# Patient Record
Sex: Female | Born: 1975 | Race: Black or African American | Hispanic: No | Marital: Single | Smoking: Former smoker
Health system: Southern US, Community
[De-identification: ages and names within clinical notes are randomized; demographics above are authoritative.]

## PROBLEM LIST (undated history)

## (undated) DIAGNOSIS — E079 Disorder of thyroid, unspecified: Secondary | ICD-10-CM

## (undated) DIAGNOSIS — H548 Legal blindness, as defined in USA: Secondary | ICD-10-CM

## (undated) DIAGNOSIS — R569 Unspecified convulsions: Secondary | ICD-10-CM

## (undated) DIAGNOSIS — I10 Essential (primary) hypertension: Secondary | ICD-10-CM

## (undated) DIAGNOSIS — S0990XS Unspecified injury of head, sequela: Secondary | ICD-10-CM

## (undated) DIAGNOSIS — I639 Cerebral infarction, unspecified: Secondary | ICD-10-CM

## (undated) DIAGNOSIS — E785 Hyperlipidemia, unspecified: Secondary | ICD-10-CM

## (undated) DIAGNOSIS — R270 Ataxia, unspecified: Secondary | ICD-10-CM

## (undated) HISTORY — PX: TRACHEOSTOMY: SUR1362

## (undated) HISTORY — DX: Hyperlipidemia, unspecified: E78.5

## (undated) HISTORY — PX: SPLENECTOMY: SUR1306

## (undated) HISTORY — PX: JEJUNOSTOMY FEEDING TUBE: SUR737

---

## 1998-11-11 ENCOUNTER — Emergency Department (HOSPITAL_COMMUNITY): Admission: EM | Admit: 1998-11-11 | Discharge: 1998-11-11 | Payer: Self-pay | Admitting: Emergency Medicine

## 1998-12-01 ENCOUNTER — Emergency Department (HOSPITAL_COMMUNITY): Admission: EM | Admit: 1998-12-01 | Discharge: 1998-12-01 | Payer: Self-pay | Admitting: Emergency Medicine

## 1999-04-09 ENCOUNTER — Inpatient Hospital Stay (HOSPITAL_COMMUNITY): Admission: AD | Admit: 1999-04-09 | Discharge: 1999-04-09 | Payer: Self-pay | Admitting: Obstetrics

## 1999-10-01 ENCOUNTER — Emergency Department (HOSPITAL_COMMUNITY): Admission: EM | Admit: 1999-10-01 | Discharge: 1999-10-01 | Payer: Self-pay

## 1999-10-03 ENCOUNTER — Emergency Department (HOSPITAL_COMMUNITY): Admission: EM | Admit: 1999-10-03 | Discharge: 1999-10-03 | Payer: Self-pay | Admitting: Emergency Medicine

## 1999-11-18 ENCOUNTER — Emergency Department (HOSPITAL_COMMUNITY): Admission: EM | Admit: 1999-11-18 | Discharge: 1999-11-18 | Payer: Self-pay | Admitting: Emergency Medicine

## 2000-08-05 ENCOUNTER — Emergency Department (HOSPITAL_COMMUNITY): Admission: EM | Admit: 2000-08-05 | Discharge: 2000-08-06 | Payer: Self-pay | Admitting: Emergency Medicine

## 2005-03-09 ENCOUNTER — Emergency Department (HOSPITAL_COMMUNITY): Admission: EM | Admit: 2005-03-09 | Discharge: 2005-03-09 | Payer: Self-pay | Admitting: Emergency Medicine

## 2005-03-13 ENCOUNTER — Emergency Department (HOSPITAL_COMMUNITY): Admission: EM | Admit: 2005-03-13 | Discharge: 2005-03-13 | Payer: Self-pay | Admitting: Emergency Medicine

## 2005-05-04 ENCOUNTER — Encounter: Admission: RE | Admit: 2005-05-04 | Discharge: 2005-05-04 | Payer: Self-pay | Admitting: Neurology

## 2006-02-05 ENCOUNTER — Emergency Department (HOSPITAL_COMMUNITY): Admission: EM | Admit: 2006-02-05 | Discharge: 2006-02-05 | Payer: Self-pay | Admitting: Emergency Medicine

## 2006-05-06 ENCOUNTER — Ambulatory Visit: Payer: Self-pay | Admitting: Family Medicine

## 2010-04-13 ENCOUNTER — Encounter: Payer: Self-pay | Admitting: Neurology

## 2014-09-14 ENCOUNTER — Emergency Department (HOSPITAL_BASED_OUTPATIENT_CLINIC_OR_DEPARTMENT_OTHER)
Admission: EM | Admit: 2014-09-14 | Discharge: 2014-09-14 | Disposition: A | Discharge status: Home / Self Care | Payer: Medicaid - Out of State | Attending: Emergency Medicine | Admitting: Emergency Medicine

## 2014-09-14 ENCOUNTER — Encounter (HOSPITAL_BASED_OUTPATIENT_CLINIC_OR_DEPARTMENT_OTHER): Payer: Self-pay

## 2014-09-14 DIAGNOSIS — Y998 Other external cause status: Secondary | ICD-10-CM | POA: Insufficient documentation

## 2014-09-14 DIAGNOSIS — Z720 Tobacco use: Secondary | ICD-10-CM | POA: Diagnosis not present

## 2014-09-14 DIAGNOSIS — W01198A Fall on same level from slipping, tripping and stumbling with subsequent striking against other object, initial encounter: Secondary | ICD-10-CM | POA: Insufficient documentation

## 2014-09-14 DIAGNOSIS — Y9289 Other specified places as the place of occurrence of the external cause: Secondary | ICD-10-CM | POA: Insufficient documentation

## 2014-09-14 DIAGNOSIS — Z23 Encounter for immunization: Secondary | ICD-10-CM | POA: Diagnosis not present

## 2014-09-14 DIAGNOSIS — Z8673 Personal history of transient ischemic attack (TIA), and cerebral infarction without residual deficits: Secondary | ICD-10-CM | POA: Diagnosis not present

## 2014-09-14 DIAGNOSIS — H548 Legal blindness, as defined in USA: Secondary | ICD-10-CM | POA: Insufficient documentation

## 2014-09-14 DIAGNOSIS — Z880 Allergy status to penicillin: Secondary | ICD-10-CM | POA: Insufficient documentation

## 2014-09-14 DIAGNOSIS — Y9389 Activity, other specified: Secondary | ICD-10-CM | POA: Diagnosis not present

## 2014-09-14 DIAGNOSIS — S0181XA Laceration without foreign body of other part of head, initial encounter: Secondary | ICD-10-CM | POA: Insufficient documentation

## 2014-09-14 HISTORY — DX: Cerebral infarction, unspecified: I63.9

## 2014-09-14 HISTORY — DX: Legal blindness, as defined in USA: H54.8

## 2014-09-14 HISTORY — DX: Unspecified convulsions: R56.9

## 2014-09-14 HISTORY — DX: Unspecified injury of head, sequela: R27.0

## 2014-09-14 HISTORY — DX: Unspecified injury of head, sequela: S09.90XS

## 2014-09-14 MED ORDER — TETANUS-DIPHTH-ACELL PERTUSSIS 5-2.5-18.5 LF-MCG/0.5 IM SUSP
0.5000 mL | Freq: Once | INTRAMUSCULAR | Status: AC
  Administered 2014-09-14: 0.5 mL via INTRAMUSCULAR
  Filled 2014-09-14: qty 0.5

## 2014-09-14 MED ORDER — LIDOCAINE HCL (PF) 1 % IJ SOLN
5.0000 mL | Freq: Once | INTRAMUSCULAR | Status: AC
  Administered 2014-09-14: 5 mL via INTRADERMAL
  Filled 2014-09-14: qty 5

## 2014-09-14 NOTE — ED Notes (Signed)
Trip/fall approx 1pm-struck face on steps-pain to neck and lac to chin-hard c-collar applied in triage

## 2014-09-14 NOTE — ED Provider Notes (Signed)
CSN: [id number]     Arrival date & time 09/14/14  1555 History   First MD Initiated Contact with Patient 09/14/14 1624     Chief Complaint  Patient presents with  . Facial Laceration     (Consider location/radiation/quality/duration/timing/severity/associated sxs/prior Treatment) The history is provided by the patient and medical records.   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 Patient was playing hide and seek with her daughter and attempted to run up the stairs when she fell and struck her face on wooden steps. She denies loss of consciousness. She sustained a small laceration to the left side of her chin just below her lower lip. She states her neck feels sore but denies pain. She denies numbness or weakness of her extremities. No dizziness, lightheadedness, or feelings of syncope.  She did take 2 tylenol prior to arrival. Date of last tetanus unknown.  Past Medical History  Diagnosis Date  . Legally blind   . Stroke   . Seizures   . 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 Splenectomy     No family history on file. History  Substance Use Topics  . Smoking status: Current Every Day Smoker  . Smokeless tobacco: Not on file  . Alcohol Use: Yes   OB History    No data available     Review of Systems  Skin: Positive for wound.  All other systems reviewed and are negative.     Allergies  Penicillins  Home Medications   Prior to Admission medications   Not on File   BP 149/89 mmHg  Pulse 69  Temp(Src) 98.2 F (36.8 C) (Oral)  Resp 18  Ht 5\' 2"  (1.575 m)  Wt 125 lb (56.7 kg)  BMI 22.86 kg/m2  SpO2 100%  LMP 09/06/2014   Physical Exam  Constitutional: She is oriented to person, place, and time. She appears well-developed and well-nourished.  HENT:  Head: Normocephalic and  atraumatic.    Mouth/Throat: Uvula is midline, oropharynx is clear and moist and mucous membranes are normal.  1cm laceration to left side of chin; slightly through and through lower lip; does not involve vermilion border; no active bleeding or retained FB; dentition intact, no tongue laceration  Eyes: Conjunctivae and EOM are normal. Pupils are equal, round, and reactive to light.  Neck: Normal range of motion.  Cardiovascular: Normal rate, regular rhythm and normal heart sounds.   Pulmonary/Chest: Effort normal and breath sounds normal.  Abdominal: Soft. Bowel sounds are normal.  Musculoskeletal: Normal range of motion.       Cervical back: Normal. She exhibits normal range of motion, no tenderness, no bony tenderness, no deformity and no pain.  Neurological: She is alert and oriented to person, place, and time.  AAOx3, answering questions appropriately; equal strength UE and LE bilaterally; CN grossly intact; moves all extremities appropriately with purposeful movements; no focal neuro deficits or facial asymmetry appreciated  Skin: Skin is warm and dry.  Psychiatric: She has a normal mood and affect.  Nursing note and vitals reviewed.   ED Course  Procedures (including critical care time)  LACERATION REPAIR Performed by: Garlon Hatchet Authorized by: Garlon Hatchet Consent: Verbal consent obtained. Risks and benefits: risks, benefits and alternatives were discussed Consent given by: patient Patient identity confirmed: provided demographic data  Prepped and Draped in normal sterile fashion Wound explored  Laceration Location: left chin  Laceration Length: 1cm  No Foreign Bodies seen or palpated  Anesthesia: local infiltration  Local anesthetic: lidocaine 1% without epinephrine  Anesthetic total: 3 ml  Irrigation method: syringe Amount of cleaning: standard  Skin closure: 6-0 prolene  Number of sutures: 2  Technique: simple interrupted  Patient tolerance:  Patient tolerated the procedure well with no immediate complications.  Labs Review Labs Reviewed - No data to display  Imaging Review No results found.   EKG Interpretation None      MDM   Final diagnoses:  Facial laceration, initial encounter   39 y.o. F with trip and fall up the stairs.  Patient is legally blind with baseline ataxia, states not abnormal for her to fall.  She sustained a laceration to the left side of her chin. Denies loss of consciousness. She has some neck soreness however there is no noted deformity or focal tenderness on exam. She maintains full range of motion. Normal strength and sensation of bilateral upper extremities. C-spine cleared by Nexus criteria.  Normal neurologic exam here.  Laceration repaired as above, patient tolerated well. Tetanus was updated. Instructed on home wound care. She will follow-up in one week for suture removal.  Discussed plan with patient, he/she acknowledged understanding and agreed with plan of care.  Return precautions given for new or worsening symptoms.   Garlon Hatchet, PA-C 09/14/14 1801  Tilden Fossa, MD 09/14/14 [phone]

## 2014-09-14 NOTE — Discharge Instructions (Signed)
Keep sutures clean and dry. Follow-up to have sutures removed in 1 week. Return here for new concerns.

## 2014-09-19 ENCOUNTER — Emergency Department (HOSPITAL_BASED_OUTPATIENT_CLINIC_OR_DEPARTMENT_OTHER)
Admission: EM | Admit: 2014-09-19 | Discharge: 2014-09-19 | Disposition: A | Discharge status: Home / Self Care | Payer: Medicaid - Out of State | Attending: Emergency Medicine | Admitting: Emergency Medicine

## 2014-09-19 ENCOUNTER — Encounter (HOSPITAL_BASED_OUTPATIENT_CLINIC_OR_DEPARTMENT_OTHER): Payer: Self-pay

## 2014-09-19 DIAGNOSIS — N390 Urinary tract infection, site not specified: Secondary | ICD-10-CM | POA: Diagnosis not present

## 2014-09-19 DIAGNOSIS — Z720 Tobacco use: Secondary | ICD-10-CM | POA: Insufficient documentation

## 2014-09-19 DIAGNOSIS — H548 Legal blindness, as defined in USA: Secondary | ICD-10-CM | POA: Diagnosis not present

## 2014-09-19 DIAGNOSIS — Z8673 Personal history of transient ischemic attack (TIA), and cerebral infarction without residual deficits: Secondary | ICD-10-CM | POA: Insufficient documentation

## 2014-09-19 DIAGNOSIS — N939 Abnormal uterine and vaginal bleeding, unspecified: Secondary | ICD-10-CM | POA: Insufficient documentation

## 2014-09-19 DIAGNOSIS — Z4802 Encounter for removal of sutures: Secondary | ICD-10-CM | POA: Insufficient documentation

## 2014-09-19 DIAGNOSIS — D649 Anemia, unspecified: Secondary | ICD-10-CM | POA: Insufficient documentation

## 2014-09-19 DIAGNOSIS — Z3202 Encounter for pregnancy test, result negative: Secondary | ICD-10-CM | POA: Diagnosis not present

## 2014-09-19 DIAGNOSIS — Z880 Allergy status to penicillin: Secondary | ICD-10-CM | POA: Diagnosis not present

## 2014-09-19 LAB — CBC WITH DIFFERENTIAL/PLATELET
BASOS ABS: 0.1 10*3/uL (ref 0.0–0.1)
BASOS PCT: 1 % (ref 0–1)
EOS ABS: 0.3 10*3/uL (ref 0.0–0.7)
Eosinophils Relative: 4 % (ref 0–5)
HEMATOCRIT: 30.8 % — AB (ref 36.0–46.0)
HEMOGLOBIN: 9.4 g/dL — AB (ref 12.0–15.0)
LYMPHS ABS: 2.5 10*3/uL (ref 0.7–4.0)
Lymphocytes Relative: 30 % (ref 12–46)
MCH: 22.4 pg — AB (ref 26.0–34.0)
MCHC: 30.5 g/dL (ref 30.0–36.0)
MCV: 73.3 fL — AB (ref 78.0–100.0)
Monocytes Absolute: 0.7 10*3/uL (ref 0.1–1.0)
Monocytes Relative: 9 % (ref 3–12)
NEUTROS ABS: 4.7 10*3/uL (ref 1.7–7.7)
Neutrophils Relative %: 56 % (ref 43–77)
Platelets: 570 10*3/uL — ABNORMAL HIGH (ref 150–400)
RBC: 4.2 MIL/uL (ref 3.87–5.11)
RDW: 16.4 % — AB (ref 11.5–15.5)
WBC: 8.3 10*3/uL (ref 4.0–10.5)

## 2014-09-19 LAB — URINE MICROSCOPIC-ADD ON

## 2014-09-19 LAB — BASIC METABOLIC PANEL
ANION GAP: 8 (ref 5–15)
BUN: 11 mg/dL (ref 6–20)
CHLORIDE: 104 mmol/L (ref 101–111)
CO2: 27 mmol/L (ref 22–32)
CREATININE: 0.64 mg/dL (ref 0.44–1.00)
Calcium: 9 mg/dL (ref 8.9–10.3)
GFR calc non Af Amer: >60 mL/min (ref >60)
Glucose, Bld: 95 mg/dL (ref 65–99)
POTASSIUM: 3.5 mmol/L (ref 3.5–5.1)
Sodium: 139 mmol/L (ref 135–145)

## 2014-09-19 LAB — PREGNANCY, URINE: Preg Test, Ur: NEGATIVE

## 2014-09-19 LAB — URINALYSIS, ROUTINE W REFLEX MICROSCOPIC
BILIRUBIN URINE: NEGATIVE
Glucose, UA: NEGATIVE mg/dL
KETONES UR: 15 mg/dL — AB
LEUKOCYTES UA: NEGATIVE
Nitrite: POSITIVE — AB
PH: 6.5 (ref 5.0–8.0)
PROTEIN: NEGATIVE mg/dL
SPECIFIC GRAVITY, URINE: 1.018 (ref 1.005–1.030)
Urobilinogen, UA: 0.2 mg/dL (ref 0.0–1.0)

## 2014-09-19 LAB — WET PREP, GENITAL
TRICH WET PREP: NONE SEEN
Yeast Wet Prep HPF POC: NONE SEEN

## 2014-09-19 MED ORDER — NITROFURANTOIN MONOHYD MACRO 100 MG PO CAPS: 100.0000 mg | ORAL_CAPSULE | Freq: Two times a day (BID) | ORAL | Status: DC

## 2014-09-19 NOTE — ED Notes (Signed)
For suture removal to face-also for vaginal bleeding x 21 days

## 2014-09-19 NOTE — Discharge Instructions (Signed)

## 2014-09-19 NOTE — ED Provider Notes (Signed)
CSN: [id number]     Arrival date & time 09/19/14  1423 History   First MD Initiated Contact with Patient 09/19/14 1439     Chief Complaint  Patient presents with  . Suture / Staple Removal  . Vaginal Bleeding     (Consider location/radiation/quality/duration/timing/severity/associated sxs/prior Treatment) HPI Comments: Pt comes in with c/o needs suture removal and vaginal bleeding for 3 weeks. The sutures to her face were placed 5 days ago and she has not had any problems with the area. She states that she has been bleeding for the last 3 weeks. No abdominal pain, sob, chest. No history of similar symptoms. States that she doesn't see anyone for gynecology  The history is provided by the patient. No language interpreter was used.    Past Medical History  Diagnosis Date  . Legally blind   . Stroke   . Seizures   . 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 Splenectomy     No family history on file. History  Substance Use Topics  . Smoking status: Current Every Day Smoker  . Smokeless tobacco: Not on file  . Alcohol Use: Yes   OB History    No data available     Review of Systems  All other systems reviewed and are negative.     Allergies  Penicillins  Home Medications   Prior to Admission medications   Not on File   BP 154/87 mmHg  Pulse 66  Temp(Src) 98.6 F (37 C) (Oral)  Resp 18  Ht  (1.6 m)  Wt 135 lb (61.236 kg)  BMI 23.92 kg/m2  SpO2 100%  LMP 09/03/2014 Physical Exam  Constitutional: She is oriented to person, place, and time. She appears well-developed and well-nourished.  HENT:  Well healing sutures to the left chin  Cardiovascular: Normal rate and regular rhythm.   Pulmonary/Chest: Effort normal and breath sounds normal.  Abdominal: Soft. Bowel sounds are normal. There is no tenderness.  Genitourinary:  Blood noted in vault  Musculoskeletal: Normal range of motion.  Neurological: She is  alert and oriented to person, place, and time.  Skin: Skin is warm and dry.  Psychiatric: She has a normal mood and affect.  Nursing note and vitals reviewed.   ED Course  SUTURE REMOVAL Date/Time: 09/19/2014 3:35 PM Performed by: Teressa Lower Authorized by: Teressa Lower Consent: Verbal consent obtained. Risks and benefits: risks, benefits and alternatives were discussed Consent given by: patient Patient identity confirmed: verbally with patient Time out: Immediately prior to procedure a "time out" was called to verify the correct patient, procedure, equipment, support staff and site/side marked as required. Body area: head/neck Location details: chin Wound Appearance: clean Sutures Removed: 2 Facility: sutures placed in this facility   (including critical care time) Labs Review Labs Reviewed  WET PREP, GENITAL - Abnormal; Notable for the following:    Clue Cells Wet Prep HPF POC FEW (*)    WBC, Wet Prep HPF POC FEW (*)    All other components within normal limits  CBC WITH DIFFERENTIAL/PLATELET - Abnormal; Notable for the following:    Hemoglobin 9.4 (*)    HCT 30.8 (*)    MCV 73.3 (*)    MCH 22.4 (*)    RDW 16.4 (*)    Platelets 570 (*)    All other components within normal limits  URINALYSIS, ROUTINE W REFLEX MICROSCOPIC (NOT AT [facility]) - Abnormal; Notable for the  following:    APPearance CLOUDY (*)    Hgb urine dipstick LARGE (*)    Ketones, ur 15 (*)    Nitrite POSITIVE (*)    All other components within normal limits  URINE MICROSCOPIC-ADD ON - Abnormal; Notable for the following:    Bacteria, UA MANY (*)    All other components within normal limits  BASIC METABOLIC PANEL  PREGNANCY, URINE  GC/CHLAMYDIA PROBE AMP ([place]) NOT AT [facility]    Imaging Review No results found.   EKG Interpretation None      MDM   Final diagnoses:  Vaginal bleeding  Anemia, unspecified anemia type  Visit for suture removal  UTI (lower urinary tract infection)     Discussed follow up with gyn. Don't think any imaging is needed at this time. Pt given macrobid for uti   Teressa LowerVrinda Jordan Caraveo, NP 09/19/14 1711  Glynn OctaveStephen Rancour, MD 09/19/14 [phone]

## 2014-09-20 LAB — GC/CHLAMYDIA PROBE AMP (~~LOC~~) NOT AT ARMC
Chlamydia: NEGATIVE
Neisseria Gonorrhea: NEGATIVE

## 2020-04-04 ENCOUNTER — Emergency Department (HOSPITAL_BASED_OUTPATIENT_CLINIC_OR_DEPARTMENT_OTHER)
Admission: EM | Admit: 2020-04-04 | Discharge: 2020-04-04 | Disposition: A | Discharge status: Home / Self Care | Payer: Medicaid Other | Attending: Emergency Medicine | Admitting: Emergency Medicine

## 2020-04-04 ENCOUNTER — Encounter (HOSPITAL_BASED_OUTPATIENT_CLINIC_OR_DEPARTMENT_OTHER): Payer: Self-pay

## 2020-04-04 ENCOUNTER — Other Ambulatory Visit: Payer: Self-pay

## 2020-04-04 DIAGNOSIS — Z79899 Other long term (current) drug therapy: Secondary | ICD-10-CM | POA: Insufficient documentation

## 2020-04-04 DIAGNOSIS — Z760 Encounter for issue of repeat prescription: Secondary | ICD-10-CM | POA: Diagnosis not present

## 2020-04-04 DIAGNOSIS — Z8673 Personal history of transient ischemic attack (TIA), and cerebral infarction without residual deficits: Secondary | ICD-10-CM | POA: Diagnosis not present

## 2020-04-04 DIAGNOSIS — I10 Essential (primary) hypertension: Secondary | ICD-10-CM | POA: Insufficient documentation

## 2020-04-04 DIAGNOSIS — E079 Disorder of thyroid, unspecified: Secondary | ICD-10-CM | POA: Insufficient documentation

## 2020-04-04 DIAGNOSIS — F17210 Nicotine dependence, cigarettes, uncomplicated: Secondary | ICD-10-CM | POA: Diagnosis not present

## 2020-04-04 HISTORY — DX: Disorder of thyroid, unspecified: E07.9

## 2020-04-04 HISTORY — DX: Essential (primary) hypertension: I10

## 2020-04-04 MED ORDER — PHENYTOIN SODIUM EXTENDED 100 MG PO CAPS: 100.0000 mg | ORAL_CAPSULE | Freq: Three times a day (TID) | ORAL | 0 refills | Status: DC

## 2020-04-04 MED ORDER — AMLODIPINE BESYLATE 10 MG PO TABS: 10.0000 mg | ORAL_TABLET | Freq: Every day | ORAL | 0 refills | Status: DC

## 2020-04-04 NOTE — ED Triage Notes (Signed)
Pt c/o HA x 2 days-denies fever/flu sx-states she also needs rx refills of multiple meds-NAD

## 2020-04-04 NOTE — Discharge Instructions (Addendum)
Find a primary care provider, use the card you showed me and call to set up care, you can also use the above provider

## 2020-04-04 NOTE — ED Provider Notes (Signed)
[facility] EMERGENCY DEPARTMENT Provider Note   CSN: [id number] Arrival date & time: 04/04/20  1216     History Chief Complaint  Patient presents with  . Headache  . Medication Refill    Carrie Day is a 45 y.o. female.  Patient claims that she has no symptoms on my evaluation she denies headaches chest pain shortness of breath strokelike symptoms vision changes.  She says she is overall well-appearing no sick symptoms no history of trauma.  She is recently moved from [place] is out of her thyroid medication, she cannot tell me if she is hypo or hyperthyroid or what the medicine is or what the doses.  She is also out of her Dilantin 100 mg 3 times daily, she is also out of her amlodipine 10 mg daily.   Medication Refill      Past Medical History:  Diagnosis Date  . 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 Hypertension   . Legally blind   . Seizures (HCC)   . Stroke (HCC)   . Thyroid disease     There are no problems to display for this patient.   Past Surgical History:  Procedure Laterality Date  . SPLENECTOMY       OB History   No obstetric history on file.     No family history on file.  Social History   Tobacco Use  . Smoking status: Current Every Day Smoker    Types: Cigarettes  . Smokeless tobacco: Never Used  Vaping Use  . Vaping Use: Never used  Substance Use Topics  . Alcohol use: Yes    Comment: occ  . Drug use: Yes    Types: Marijuana    Home Medications Prior to Admission medications   Medication Sig Start Date End Date Taking? Authorizing Provider  amLODipine (NORVASC) 10 MG tablet Take 1 tablet (10 mg total) by mouth daily. 04/04/20 05/04/20 Yes Sabino Donovan, MD  phenytoin (DILANTIN) 100 MG ER capsule Take 1 capsule (100 mg total) by mouth 3 (three) times daily. 04/04/20 05/04/20 Yes Sabino Donovan, MD  nitrofurantoin, macrocrystal-monohydrate, (MACROBID) 100 MG capsule Take 1 capsule (100 mg total) by mouth 2 (two) times daily.  09/19/14   Teressa Lower, NP    Allergies    Penicillins  Review of Systems   Review of Systems  Constitutional: Negative for chills and fever.  HENT: Negative for congestion and rhinorrhea.   Respiratory: Negative for cough and shortness of breath.   Cardiovascular: Negative for chest pain and palpitations.  Gastrointestinal: Negative for diarrhea, nausea and vomiting.  Genitourinary: Negative for difficulty urinating and dysuria.  Musculoskeletal: Negative for arthralgias and back pain.  Skin: Negative for rash and wound.  Neurological: Negative for light-headedness and headaches.    Physical Exam Updated Vital Signs BP (!) 152/102 (BP Location: Right Arm)   Pulse 65   Temp 98.4 F (36.9 C) (Oral)   Resp 18   Ht 5\' 3"  (1.6 m)   Wt 63.5 kg   LMP 09/06/2014   SpO2 99%   BMI 24.80 kg/m   Physical Exam Vitals and nursing note reviewed. Exam conducted with a chaperone present.  Constitutional:      General: She is not in acute distress.    Appearance: Normal appearance.  HENT:     Head: Normocephalic and atraumatic.     Nose: No rhinorrhea.  Eyes:     General:        Right eye:  No discharge.        Left eye: No discharge.     Conjunctiva/sclera: Conjunctivae normal.  Cardiovascular:     Rate and Rhythm: Normal rate and regular rhythm.  Pulmonary:     Effort: Pulmonary effort is normal. No respiratory distress.     Breath sounds: No stridor.  Abdominal:     General: Abdomen is flat. There is no distension.     Palpations: Abdomen is soft.  Musculoskeletal:        General: No tenderness or signs of injury.  Skin:    General: Skin is warm and dry.  Neurological:     General: No focal deficit present.     Mental Status: She is alert. Mental status is at baseline.     Motor: No weakness.  Psychiatric:        Mood and Affect: Mood normal.        Behavior: Behavior normal.     ED Results / Procedures / Treatments   Labs (all labs ordered are listed, but  only abnormal results are displayed) Labs Reviewed - No data to display  EKG None  Radiology No results found.  Procedures Procedures (including critical care time)  Medications Ordered in ED Medications - No data to display  ED Course  I have reviewed the triage vital signs and the nursing notes.  Pertinent labs & imaging results that were available during my care of the patient were reviewed by me and considered in my medical decision making (see chart for details).    MDM Rules/Calculators/A&P                          Medications of amlodipine and Dilantin will be refilled as the patient has the prescription I can see the dosages and regimen.  As for the thyroid medicine I will have her call her doctor to get refills and try and get this sorted out.  She has no signs or symptoms of thyroid dysfunction, she is overall well-appearing.  She has mild elevated hypertension but has not taken her medicine today.  No signs of endorgan dysfunction.  No need for further testing.  She is safe for discharge home. Final Clinical Impression(s) / ED Diagnoses Final diagnoses:  Medication refill    Rx / DC Orders ED Discharge Orders         Ordered    phenytoin (DILANTIN) 100 MG ER capsule  3 times daily        04/04/20 1547    amLODipine (NORVASC) 10 MG tablet  Daily        04/04/20 1547           Sabino Donovan, MD 04/04/20 1559

## 2020-08-08 ENCOUNTER — Telehealth: Payer: Self-pay

## 2020-09-05 ENCOUNTER — Ambulatory Visit: Payer: Medicaid Other | Admitting: Obstetrics

## 2020-09-30 ENCOUNTER — Ambulatory Visit (INDEPENDENT_AMBULATORY_CARE_PROVIDER_SITE_OTHER): Payer: Medicaid Other | Admitting: Obstetrics

## 2020-09-30 ENCOUNTER — Encounter: Payer: Self-pay | Admitting: Obstetrics

## 2020-09-30 ENCOUNTER — Other Ambulatory Visit: Payer: Self-pay

## 2020-09-30 ENCOUNTER — Other Ambulatory Visit (HOSPITAL_COMMUNITY)
Admission: RE | Admit: 2020-09-30 | Discharge: 2020-09-30 | Disposition: A | Discharge status: Home / Self Care | Payer: Medicaid Other | Source: Ambulatory Visit | Attending: Obstetrics | Admitting: Obstetrics

## 2020-09-30 VITALS — BP 121/87 | HR 68 | Wt 131.0 lb

## 2020-09-30 DIAGNOSIS — N898 Other specified noninflammatory disorders of vagina: Secondary | ICD-10-CM

## 2020-09-30 DIAGNOSIS — Z780 Asymptomatic menopausal state: Secondary | ICD-10-CM

## 2020-09-30 DIAGNOSIS — N889 Noninflammatory disorder of cervix uteri, unspecified: Secondary | ICD-10-CM | POA: Diagnosis not present

## 2020-09-30 DIAGNOSIS — B369 Superficial mycosis, unspecified: Secondary | ICD-10-CM

## 2020-09-30 MED ORDER — CLOTRIMAZOLE 1 % EX CREA: 1.0000 "application " | TOPICAL_CREAM | Freq: Two times a day (BID) | CUTANEOUS | 0 refills | Status: DC

## 2020-09-30 NOTE — Progress Notes (Addendum)
Patient ID: [name], female   DOB: 1975/11/08, 45 y.o.   MRN: [id number]  Chief Complaint  Patient presents with   New Patient (Initial Visit)    HPI Carrie Day is a 45 y.o. female.  Patient is referred for consultation for what appeared to be increased vacuolization of cervix; otherwise, exam was normal except for thick white vaginal discharge and the patient gives history of vulva itching.  Patient's pap was negative with negative HRHPV.  She is postmenopausal and denies hot flashes. Patient is sexually active but denies any, vaginal bleeding or pelvic pain.  Records are unavailable for cultures that may have been done, but she states that she was prescribed a cream to use for the vulva itching and 1 pill to take for the vaginal discharge and itching.  She states that she took the Diflucan pill but did not pick up Nystatin cream from pharmacy. 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 No pertinent family history.  Social History Social History   Tobacco Use   Smoking status: Every Day    Pack years: 0.00    Types: Cigarettes   Smokeless tobacco: Never   Tobacco comments:    3-4 cigs/day  Vaping Use   Vaping Use: Never used  Substance Use Topics   Alcohol use: Yes    Comment: occ   Drug use: Yes    Types: Marijuana    Allergies  Allergen Reactions   Penicillins Other (See Comments)    Unknown     Current Outpatient Medications  Medication Sig Dispense Refill   amLODipine (NORVASC) 10 MG tablet Take 1 tablet (10 mg total) by mouth daily. 30 tablet 0   cholecalciferol (VITAMIN D3) 25 MCG (1000 UNIT) tablet Take 1,000 Units by mouth daily.     clotrimazole (LOTRIMIN) 1 % cream Apply 1 application topically 2 (two) times daily. 60 g 0    levothyroxine (SYNTHROID) 100 MCG tablet Take 100 mcg by mouth daily before breakfast.     phenytoin (DILANTIN) 100 MG ER capsule Take 1 capsule (100 mg total) by mouth 3 (three) times daily. 90 capsule 0   No current facility-administered medications for this visit.    Review of Systems Review of Systems Constitutional: negative for fatigue and weight loss Respiratory: negative for cough and wheezing Cardiovascular: negative for chest pain, fatigue and palpitations Gastrointestinal: negative for abdominal pain and change in bowel habits Genitourinary: positive for vaginal discharge and vulva itching Integument/breast: negative for nipple discharge Musculoskeletal:negative for myalgias Neurological: negative for gait problems and tremors Behavioral/Psych: negative for abusive relationship, depression Endocrine: negative for temperature intolerance      Blood pressure 121/87, pulse 68, weight 131 lb (59.4 kg), last menstrual period 09/06/2014.  Physical Exam Physical Exam General:   Alert and no distress  Skin:   no rash or abnormalities  Lungs:   clear to auscultation bilaterally  Heart:   regular rate and rhythm, S1, S2 normal, no murmur, click, rub or gallop  Breasts:   normal without suspicious masses, skin or nipple changes or axillary nodes  Abdomen:  normal findings: no organomegaly, soft, non-tender and no hernia  Pelvis:  External genitalia: fungal  rash of vulva Urinary system: urethral meatus normal and bladder without fullness, nontender Vaginal: normal without tenderness, induration or masses Cervix: normal appearance Adnexa: normal bimanual exam Uterus: anteverted and non-tender, normal size    I have spent a total of 20 minutes of face-to-face time, excluding clinical staff time, reviewing notes and preparing to see patient, ordering tests and/or medications, and counseling the patient.   Data Reviewed Clinical referral notes and labs  Assessment     1. Cervix  abnormality described as increased vascularity on exam by referral agent - normal cervical examination by me.  Cervix has no abnormal vascularity or other abnormal findings on my exam - these findings were explained to the patient and all questions were answered to her satisfaction   2. Vaginal irritation Rx: - Cervicovaginal ancillary only( [place])  3. Superficial fungus infection of skin.  Probable yeast vulvitis. Rx: - clotrimazole (LOTRIMIN) 1 % cream; Apply 1 application topically 2 (two) times daily.  Dispense: 60 g; Refill: 0  4. Postmenopause - doing well clinically  - recommend scheduling a routine screening mammogram, colonoscopy and bone density dexa scan    Plan   Follow up as needed -   Meds ordered this encounter  Medications   clotrimazole (LOTRIMIN) 1 % cream    Sig: Apply 1 application topically 2 (two) times daily.    Dispense:  60 g    Refill:  0      Brock Bad, MD 09/30/2020 10:18 AM

## 2020-10-01 ENCOUNTER — Telehealth: Payer: Self-pay

## 2020-10-01 ENCOUNTER — Other Ambulatory Visit: Payer: Self-pay | Admitting: Obstetrics

## 2020-10-01 DIAGNOSIS — B9689 Other specified bacterial agents as the cause of diseases classified elsewhere: Secondary | ICD-10-CM

## 2020-10-01 DIAGNOSIS — N760 Acute vaginitis: Secondary | ICD-10-CM

## 2020-10-01 LAB — CERVICOVAGINAL ANCILLARY ONLY
Bacterial Vaginitis (gardnerella): POSITIVE — AB
Candida Glabrata: NEGATIVE
Candida Vaginitis: NEGATIVE
Chlamydia: NEGATIVE
Comment: NEGATIVE
Comment: NEGATIVE
Comment: NEGATIVE
Comment: NEGATIVE
Comment: NEGATIVE
Comment: NORMAL
Neisseria Gonorrhea: NEGATIVE
Trichomonas: NEGATIVE

## 2020-10-01 MED ORDER — METRONIDAZOLE 500 MG PO TABS: 500.0000 mg | ORAL_TABLET | Freq: Two times a day (BID) | ORAL | 2 refills | Status: DC

## 2020-10-01 NOTE — Telephone Encounter (Signed)
-----   Message from Brock Bad, MD sent at 10/01/2020  2:44 PM EDT ----- Flagyl Rx for BV

## 2020-10-01 NOTE — Telephone Encounter (Signed)
CALL PATIENT, NO ANSWER

## 2020-10-17 ENCOUNTER — Ambulatory Visit: Payer: Medicaid Other | Attending: Family Medicine

## 2020-10-17 ENCOUNTER — Other Ambulatory Visit: Payer: Self-pay

## 2020-10-17 VITALS — BP 131/90 | HR 56

## 2020-10-17 DIAGNOSIS — R262 Difficulty in walking, not elsewhere classified: Secondary | ICD-10-CM | POA: Diagnosis present

## 2020-10-17 DIAGNOSIS — R2681 Unsteadiness on feet: Secondary | ICD-10-CM

## 2020-10-17 DIAGNOSIS — M6281 Muscle weakness (generalized): Secondary | ICD-10-CM

## 2020-10-17 DIAGNOSIS — R2689 Other abnormalities of gait and mobility: Secondary | ICD-10-CM | POA: Diagnosis present
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
## 2020-11-05 ENCOUNTER — Ambulatory Visit: Payer: Medicaid Other | Attending: Family Medicine

## 2020-11-05 DIAGNOSIS — M6281 Muscle weakness (generalized): Secondary | ICD-10-CM | POA: Insufficient documentation

## 2020-11-05 DIAGNOSIS — R2689 Other abnormalities of gait and mobility: Secondary | ICD-10-CM | POA: Insufficient documentation

## 2020-11-05 DIAGNOSIS — R2681 Unsteadiness on feet: Secondary | ICD-10-CM | POA: Insufficient documentation

## 2020-11-05 DIAGNOSIS — R262 Difficulty in walking, not elsewhere classified: Secondary | ICD-10-CM | POA: Insufficient documentation

## 2020-11-07 ENCOUNTER — Ambulatory Visit: Payer: Medicaid Other

## 2020-11-12 ENCOUNTER — Other Ambulatory Visit: Payer: Self-pay

## 2020-11-12 ENCOUNTER — Ambulatory Visit: Payer: Medicaid Other

## 2020-11-12 VITALS — BP 139/96 | HR 62

## 2020-11-12 DIAGNOSIS — M6281 Muscle weakness (generalized): Secondary | ICD-10-CM

## 2020-11-12 DIAGNOSIS — R2689 Other abnormalities of gait and mobility: Secondary | ICD-10-CM

## 2020-11-12 DIAGNOSIS — R262 Difficulty in walking, not elsewhere classified: Secondary | ICD-10-CM | POA: Diagnosis present

## 2020-11-12 DIAGNOSIS — R2681 Unsteadiness on feet: Secondary | ICD-10-CM | POA: Diagnosis present

## 2020-11-12 NOTE — Patient Instructions (Signed)
Access Code: [email or web address] URL: [email or web address] Date: 11/12/2020 Prepared by: Jethro Bastos  Exercises Sit to Stand Without Arm Support - 1 x daily - 5 x weekly - 2 sets - 10 reps

## 2020-11-12 NOTE — Therapy (Signed)
[facility] [address] [place], [place], [address] Phone: [phone]   Fax:  [phone]  Physical Therapy Treatment  Patient Details  Name: Carrie Day MRN: [id number] Date of Birth: August 31, 1975 Referring Provider (PT): Payton Emerald, MD   Encounter Date: 11/12/2020   PT End of Session - 11/12/20 1019     Visit Number 2    Number of Visits 13    Date for PT Re-Evaluation 12/12/20    Authorization Type [employer] (Auth required after 12 visits; VL: 27)    PT Start Time 1016    PT Stop Time 1058    PT Time Calculation (min) 42 min    Equipment Utilized During Treatment Gait belt    Activity Tolerance Patient tolerated treatment well    Behavior During Therapy WFL for tasks assessed/performed             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 No falls to report. Has SPC but reports unsure how to use it. No pain.    Patient is accompained by: Family member   Rob   Pertinent History Seizures, Stroke, HTN, Ataxia, Thyroid Disease, TBI HLD, Vit D Deficiency    Limitations Standing;Walking;House hold activities    Patient Stated Goals Walk down stairs without holding onto something/somebody; be able to jump    Currently in Pain? No/denies               [facility] Adult PT Treatment/Exercise - 11/12/20 0001       Transfers   Transfers Sit to Stand;Stand to Sit    Sit to Stand 5: Supervision;4: Min guard    Stand to Sit 5: Supervision;4: Min guard    Number of Reps 10 reps;1 set    Comments completed sit <> stand training  working on improve dpositioning and avoidance of recurvatum with R Knee and contorel with descent. Initaited HEP      Ambulation/Gait   Ambulation/Gait Yes    Ambulation/Gait Assistance 4: Min guard    Ambulation/Gait Assistance Details Completed extensive gait training today during session: initiated gait training with SPC as patient carrying into session. PT adjusted for appropriate height. Paitent dmeo significant challenge with sequencing cane, and therefore increased balance challenge due to ataxia. Progressed to trailaing AFO on RLE. Completed with R [place]ok intiailly, with continue to demo recurvatum and intermittent catching of toe. Then trialed R Thuasne with small heel wedge, patient demo most improvements iwth gait pattern with addition of small heel wedge on RLE. May even benefit from slightly thicker heel wedge due to continued mild recurvatum intermittently. PT providing cues for soft bend with stance. Patient reporting interest in AFO due to improved ability to ambulate and feeling safer due to toe clearance. PT educating on process of obtaining AFO.    Ambulation Distance (Feet) 230 Feet   x 3   Assistive device Straight cane    Gait Pattern Step-through pattern;Decreased arm swing - right;Decreased step length - left;Decreased stance time -  right;Decreased hip/knee flexion - right;Decreased dorsiflexion - right;Decreased weight shift to right;Right circumduction;Right foot flat;Right genu recurvatum;Poor foot clearance - right    Ambulation Surface Level;Indoor             Access Code: [email or web address] URL: [email or web address] Date: 11/12/2020 Prepared by: Jethro Bastos  Exercises Sit to Stand Without Arm Support - 1 x daily - 5 x weekly - 2 sets - 10 reps        PT Education - 11/12/20 1053     Education Details Educated on Bracing Options; Initial HEP    Person(s) Educated Patient    Methods Explanation;Demonstration;Handout    Comprehension Returned  demonstration;Verbalized understanding              PT Short Term Goals - 10/17/20 1750       PT SHORT TERM GOAL #1   Title Patient will be independent with initial HEP for strength/balance (ALL STGs Due: 11/21/20)    Baseline no HEP established    Time 3    Period Weeks    Status New    Target Date 11/21/20      PT SHORT TERM GOAL #2   Title Patient will improve TUG to </= 15 seconds    Baseline 16.65 secs    Time 3    Period Weeks    Status New      PT SHORT TERM GOAL #3   Title Patient will improve gait speed w/ LRAD to >/= 1.70 ft/sec    Baseline 1.50 ft/sec    Time 3    Period Weeks    Status New               PT Long Term Goals - 10/17/20 1752       PT LONG TERM GOAL #1   Title Patient will be independent with final HEP for strength/balance (ALL LTGs Due: 12/12/20)    Baseline no HEP established    Time 6    Period Weeks    Status New    Target Date 12/12/20      PT LONG TERM GOAL #2   Title Patient will improve gait speed w/ LRAD to >/= 2.0 ft/sec    Baseline 1.5 ft/sec    Time 6    Period Weeks    Status New      PT LONG TERM GOAL #3   Title Patient will improve Berh Balance to >/= 45/56 to demo reduced fall risk    Baseline 38/56    Time 6    Period Weeks    Status New      PT LONG TERM GOAL #4   Title Patient will improve TUG to </= 12 secs w/ LRAD to demo reduced fall risk    Baseline 16.65 secs    Time 6    Period Weeks    Status New      PT LONG TERM GOAL #5   Title Patient will improve 5x sit <> stand to </= 20 with BUE support    Baseline 27.09    Time 6    Period Weeks    Status New      Additional Long Term Goals   Additional Long Term Goals Yes      PT LONG TERM GOAL #6   Title Patient will be able to ascend/descend stairs with step to pattern and no rails supervision to allow for improved entry/exit into home    Baseline Bilat rails or holding onto family  member    Time 6    Period Weeks    Status New                    Plan - 11/12/20 1109     Clinical Impression Statement Trialed various AFO on RLE today during session, most improvement noted with R Thuasne brace with addition of small heel wedge. Patient demo significant challenge with use of SPC due to ataxia and sequencing difficulty. Initiated HEP today. Will continue per POC.    Personal Factors and Comorbidities Comorbidity 3+;Time since onset of injury/illness/exacerbation    Comorbidities Seizures, Stroke, HTN, Ataxia, Thyroid Disease, TBI HLD, Vit D Deficiency    Examination-Activity Limitations Stairs;Stand;Locomotion Level;Transfers    Examination-Participation Restrictions Community Activity    Stability/Clinical Decision Making Stable/Uncomplicated    Rehab Potential Fair    PT Frequency 2x / week    PT Duration 6 weeks    PT Treatment/Interventions ADLs/Self Care Home Management;Aquatic Therapy;Cryotherapy;Electrical Stimulation;Moist Heat;DME Instruction;Gait training;Stair training;Functional mobility training;Contrast Bath;Neuromuscular re-education;Therapeutic activities;Balance training;Patient/family education;Therapeutic exercise;Orthotic Fit/Training;Dry needling;Manual techniques;Passive range of motion;Vestibular;Joint Manipulations    PT Next Visit Plan Review HEP. Continue gait training with R Thuasne brace with small heel wedge, may need to trial various heights of heel wedge to reduce recurvatum. Add more strengthening/balance to HEP.    Consulted and Agree with Plan of Care Patient             Patient will benefit from skilled therapeutic intervention in order to improve the following deficits and impairments:  Abnormal gait, Decreased balance, Decreased activity tolerance, Decreased coordination, Decreased safety awareness, Decreased strength, Decreased knowledge of use of DME, Impaired tone, Decreased range of motion, Difficulty walking, Impaired vision/preception  Visit Diagnosis: Muscle weakness  (generalized)  Unsteadiness on feet  Difficulty in walking, not elsewhere classified  Other abnormalities of gait and mobility     Problem List There are no problems to display for this patient.   Tempie Donning, PT, DPT 11/12/2020, 11:12 AM  [facility] [address] [place], [place], [address] Phone: [phone]   Fax:  [phone]  Name: Carrie Day MRN: [id number] Date of Birth: 06-23-1975

## 2020-11-14 ENCOUNTER — Other Ambulatory Visit: Payer: Self-pay

## 2020-11-14 ENCOUNTER — Ambulatory Visit: Payer: Medicaid Other | Admitting: Physical Therapy

## 2020-11-14 DIAGNOSIS — R262 Difficulty in walking, not elsewhere classified: Secondary | ICD-10-CM

## 2020-11-14 DIAGNOSIS — R2681 Unsteadiness on feet: Secondary | ICD-10-CM

## 2020-11-14 DIAGNOSIS — M6281 Muscle weakness (generalized): Secondary | ICD-10-CM | POA: Diagnosis not present

## 2020-11-14 DIAGNOSIS — R2689 Other abnormalities of gait and mobility: Secondary | ICD-10-CM

## 2020-11-14 NOTE — Therapy (Signed)
[facility] [address] [place], [place], [address] Phone: [phone]   Fax:  [phone]  Physical Therapy Treatment  Patient Details  Name: Carrie Day MRN: [id number] Date of Birth: 04-21-1975 Referring Provider (PT): Payton Emerald, MD   Encounter Date: 11/14/2020   PT End of Session - 11/14/20 1021     Visit Number 3    Number of Visits 13    Date for PT Re-Evaluation 12/12/20    Authorization Type [employer] (Auth required after 12 visits; VL: 27)    PT Start Time 1018    PT Stop Time 1100    PT Time Calculation (min) 42 min    Equipment Utilized During Treatment Gait belt    Activity Tolerance Patient tolerated treatment well    Behavior During Therapy WFL for tasks assessed/performed             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   Subjective Assessment - 11/14/20 1020     Subjective No new complaints. No falls or pain to report.    Patient is accompained by: Family member   Rob, boyfriend   Pertinent History Seizures, Stroke, HTN, Ataxia, Thyroid Disease, TBI HLD, Vit D Deficiency    Limitations Standing;Walking;House hold activities    Patient Stated Goals Walk down stairs without holding onto something/somebody; be able to jump    Currently in Pain? No/denies                   [facility] Adult PT Treatment/Exercise - 11/14/20 1027       Transfers   Transfers Sit to Stand;Stand to Sit    Sit to Stand 5: Supervision;4: Min guard;With upper extremity assist;From bed;From chair/3-in-1    Sit to Stand Details (indicate cue type and reason) cues to scoot to edge of surface and for anterior weight shifting to stand    Stand to Sit 5:  Supervision;With upper extremity assist;To bed;To chair/3-in-1    Stand to Sit Details cues to ensure all the way to surface before sitting and to reach back with sitting      Ambulation/Gait   Ambulation/Gait Yes    Ambulation/Gait Assistance 4: Min guard    Ambulation/Gait Assistance Details continued with ues of posterior Thusane brace with white heel wedge with gait. pt noted to have decreased hip/knee flexion on right side, decreased step lenght which pt could correct with cues and decreased stance time.    Ambulation Distance (Feet) 230 Feet   x1, plus around clinic with session   Assistive device None;Other (Comment)    Gait Pattern Step-through pattern;Decreased arm swing - right;Decreased step length - left;Decreased stance time - right;Decreased hip/knee flexion - right;Decreased dorsiflexion - right;Decreased weight shift to right;Right circumduction;Right foot flat;Right genu recurvatum;Poor foot clearance - right    Ambulation Surface Level;Indoor      Exercises   Exercises Other Exercises    Other Exercises  issued HEP today for strengthening and balance. Refer to Medbridge for full details. Min guard assist for balance ex's for safety. cues on ex form and technique.  Issued to HEP this session:  Access Code: [email or web address] URL: [email or web address] Date: 11/14/2020 Prepared by: Sallyanne Kuster  Exercises Sit to Stand Without Arm Support - 1 x daily - 5 x weekly - 2 sets - 10 reps Standing Forward Step Taps with Counter Support - 1 x daily - 5 x weekly - 1 sets - 10 reps Standing Near Stance in Corner - 1 x daily - 5 x weekly - 1 sets - 10 reps Standing Balance in Corner with Eyes Closed - 1 x daily - 5 x weekly - 1 sets - 3 reps - 30 sec hold Tandem Stance in Corner - 1 x daily - 5 x weekly - 1 sets - 2-3 reps - 30 sec's hold       PT Education - 11/14/20 2155     Education Details additons to [employer] Educated Patient    Methods  Explanation;Demonstration;Verbal cues;Handout    Comprehension Verbalized understanding;Returned demonstration;Verbal cues required;Need further instruction              PT Short Term Goals - 10/17/20 1750       PT SHORT TERM GOAL #1   Title Patient will be independent with initial HEP for strength/balance (ALL STGs Due: 11/21/20)    Baseline no HEP established    Time 3    Period Weeks    Status New    Target Date 11/21/20      PT SHORT TERM GOAL #2   Title Patient will improve TUG to </= 15 seconds    Baseline 16.65 secs    Time 3    Period Weeks    Status New      PT SHORT TERM GOAL #3   Title Patient will improve gait speed w/ LRAD to >/= 1.70 ft/sec    Baseline 1.50 ft/sec    Time 3    Period Weeks    Status New               PT Long Term Goals - 10/17/20 1752       PT LONG TERM GOAL #1   Title Patient will be independent with final HEP for strength/balance (ALL LTGs Due: 12/12/20)    Baseline no HEP established    Time 6    Period Weeks    Status New    Target Date 12/12/20      PT LONG TERM GOAL #2   Title Patient will improve gait speed w/ LRAD to >/= 2.0 ft/sec    Baseline 1.5 ft/sec    Time 6    Period Weeks    Status New      PT LONG TERM GOAL #3   Title Patient will improve Berh Balance to >/= 45/56 to demo reduced fall risk    Baseline 38/56    Time 6    Period Weeks    Status New      PT LONG TERM GOAL #4   Title Patient will improve TUG to </= 12 secs w/ LRAD to demo reduced fall risk    Baseline 16.65 secs    Time 6    Period Weeks    Status New      PT LONG TERM GOAL #5   Title Patient will improve 5x sit <> stand to </= 20 with BUE support    Baseline 27.09    Time 6    Period Weeks    Status New      Additional  Long Term Goals   Additional Long Term Goals Yes      PT LONG TERM GOAL #6   Title Patient will be able to ascend/descend stairs with step to pattern and no rails supervision to allow for improved entry/exit  into home    Baseline Bilat rails or holding onto family member    Time 6    Period Weeks    Status New                   Plan - 11/14/20 1021     Clinical Impression Statement Today's skilled session continued to focus on use of brace with gait with improved gait pattern noted. Remainder of session focused on establishement of an HEP for strengthening and balance with no issues noted or reported with performance in session. The pt is progressing toward goals and should benefit from continued PT to progress toward unmet goals.    Personal Factors and Comorbidities Comorbidity 3+;Time since onset of injury/illness/exacerbation    Comorbidities Seizures, Stroke, HTN, Ataxia, Thyroid Disease, TBI HLD, Vit D Deficiency    Examination-Activity Limitations Stairs;Stand;Locomotion Level;Transfers    Examination-Participation Restrictions Community Activity    Stability/Clinical Decision Making Stable/Uncomplicated    Rehab Potential Fair    PT Frequency 2x / week    PT Duration 6 weeks    PT Treatment/Interventions ADLs/Self Care Home Management;Aquatic Therapy;Cryotherapy;Electrical Stimulation;Moist Heat;DME Instruction;Gait training;Stair training;Functional mobility training;Contrast Bath;Neuromuscular re-education;Therapeutic activities;Balance training;Patient/family education;Therapeutic exercise;Orthotic Fit/Training;Dry needling;Manual techniques;Passive range of motion;Vestibular;Joint Manipulations    PT Next Visit Plan How is HEP going? Continue gait training with R Thuasne brace with small heel wedge, may need to trial various heights of heel wedge to reduce recurvatum.    Consulted and Agree with Plan of Care Patient             Patient will benefit from skilled therapeutic intervention in order to improve the following deficits and impairments:  Abnormal gait, Decreased balance, Decreased activity tolerance, Decreased coordination, Decreased safety awareness, Decreased  strength, Decreased knowledge of use of DME, Impaired tone, Decreased range of motion, Difficulty walking, Impaired vision/preception  Visit Diagnosis: Muscle weakness (generalized)  Unsteadiness on feet  Difficulty in walking, not elsewhere classified  Other abnormalities of gait and mobility     Problem List There are no problems to display for this patient.  Sallyanne Kuster, PTA, [facility] [address] [place], [place] [address] [phone] 11/14/20, 9:56 PM   Name: Carrie Day MRN: [id number] Date of Birth: 12-21-1975

## 2020-11-14 NOTE — Patient Instructions (Signed)
Access Code: [email or web address] URL: [email or web address] Date: 11/14/2020 Prepared by: Sallyanne Kuster  Exercises Sit to Stand Without Arm Support - 1 x daily - 5 x weekly - 2 sets - 10 reps Standing Forward Step Taps with Counter Support - 1 x daily - 5 x weekly - 1 sets - 10 reps Standing Near Stance in Corner - 1 x daily - 5 x weekly - 1 sets - 10 reps Standing Balance in Corner with Eyes Closed - 1 x daily - 5 x weekly - 1 sets - 3 reps - 30 sec hold Tandem Stance in Corner - 1 x daily - 5 x weekly - 1 sets - 2-3 reps - 30 sec's hold

## 2020-11-19 ENCOUNTER — Ambulatory Visit: Payer: Medicaid Other

## 2020-11-21 ENCOUNTER — Ambulatory Visit: Payer: Medicaid Other

## 2020-11-26 ENCOUNTER — Other Ambulatory Visit: Payer: Self-pay

## 2020-11-26 ENCOUNTER — Ambulatory Visit: Payer: Medicaid Other | Attending: Family Medicine

## 2020-11-26 VITALS — BP 154/106 | HR 61

## 2020-11-26 DIAGNOSIS — R2689 Other abnormalities of gait and mobility: Secondary | ICD-10-CM | POA: Diagnosis present

## 2020-11-26 DIAGNOSIS — M6281 Muscle weakness (generalized): Secondary | ICD-10-CM | POA: Insufficient documentation

## 2020-11-26 DIAGNOSIS — R262 Difficulty in walking, not elsewhere classified: Secondary | ICD-10-CM | POA: Insufficient documentation

## 2020-11-26 DIAGNOSIS — R2681 Unsteadiness on feet: Secondary | ICD-10-CM | POA: Diagnosis present

## 2020-11-26 NOTE — Therapy (Signed)
[facility] [address] [place], [place], [address] Phone: [phone]   Fax:  [phone]  Physical Therapy Treatment  Patient Details  Name: Carrie Day MRN: [id number] Date of Birth: [birth year] Referring Provider (PT): Payton Emerald, MD   Encounter Date: 11/26/2020   PT End of Session - 11/26/20 1019     Visit Number 4    Number of Visits 13    Date for PT Re-Evaluation 12/12/20    Authorization Type [employer] (Auth required after 12 visits; VL: 27)    PT Start Time 1016    PT Stop Time 1048    PT Time Calculation (min) 32 min    Equipment Utilized During Treatment Gait belt    Activity Tolerance Patient tolerated treatment well    Behavior During Therapy WFL for tasks assessed/performed             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 But no order has been placed and unsure of MD name for PT to reach out. No other new changes. Patient reports she had a fall and hit her head. Reports she did not go to the Urgent Care or ED but did see MD that same day. Told her she needed stitches but didnt feel like getting them. PT unable to see area of head due to hair in ponytail.    Patient is accompained by: Family member   Rob, boyfriend   Pertinent History Seizures, Stroke, HTN, Ataxia, Thyroid Disease, TBI HLD, Vit D Deficiency    Limitations Standing;Walking;House hold activities    Patient Stated Goals Walk down stairs without holding onto something/somebody; be able to jump     Currently in Pain? No/denies                [facility] Adult PT Treatment/Exercise - 11/26/20 0001       Transfers   Transfers Sit to Stand;Stand to Sit    Sit to Stand 5: Supervision;4: Min guard;With upper extremity assist;From bed;From chair/3-in-1    Stand to Sit 5: Supervision;With upper extremity assist;To bed;To chair/3-in-1    Stand to Sit Details cues for controlled descent and aligned with surface prior to descent.      Ambulation/Gait   Ambulation/Gait Yes    Ambulation/Gait Assistance 4: Min guard    Ambulation/Gait Assistance Details Completed gait training with use of R AFO x 300 ft. PT providing cues for base of support and improved hip/knee flexion on RLE to promote improved foot clearance. CGA throughout. PT also providing cues for slowing gait speed due to increased balance challenge with fast pace. BP after ambulation: 141/103, HR: 65. Require extended seated rest break to allow for BP to    Ambulation Distance (Feet) 300 Feet    Assistive device None    Gait Pattern Step-through pattern;Decreased arm swing - right;Decreased step length - left;Decreased stance time - right;Decreased hip/knee flexion - right;Decreased dorsiflexion - right;Decreased  weight shift to right;Right circumduction;Right foot flat;Right genu recurvatum;Poor foot clearance - right    Ambulation Surface Level;Indoor    Gait Comments donned R posterior Thuasne AFO with use of heel wedge      Self-Care   Self-Care Other Self-Care Comments    Other Self-Care Comments  Due to patient reporting recent fall and injury to head (reports MD assessed this area but PT unable to see MD's notes). PT reassessed area but due to hair being in ponytail unable to see/locate area during today's session.                    PT Education - 11/26/20 1038     Education Details Monitor BP at Home and follow up with PCP if BP continues to remain elevated.    Person(s) Educated Patient;Other (comment)     Methods Explanation    Comprehension Verbalized understanding              PT Short Term Goals - 10/17/20 1750       PT SHORT TERM GOAL #1   Title Patient will be independent with initial HEP for strength/balance (ALL STGs Due: 11/21/20)    Baseline no HEP established    Time 3    Period Weeks    Status New    Target Date 11/21/20      PT SHORT TERM GOAL #2   Title Patient will improve TUG to </= 15 seconds    Baseline 16.65 secs    Time 3    Period Weeks    Status New      PT SHORT TERM GOAL #3   Title Patient will improve gait speed w/ LRAD to >/= 1.70 ft/sec    Baseline 1.50 ft/sec    Time 3    Period Weeks    Status New               PT Long Term Goals - 10/17/20 1752       PT LONG TERM GOAL #1   Title Patient will be independent with final HEP for strength/balance (ALL LTGs Due: 12/12/20)    Baseline no HEP established    Time 6    Period Weeks    Status New    Target Date 12/12/20      PT LONG TERM GOAL #2   Title Patient will improve gait speed w/ LRAD to >/= 2.0 ft/sec    Baseline 1.5 ft/sec    Time 6    Period Weeks    Status New      PT LONG TERM GOAL #3   Title Patient will improve Berh Balance to >/= 45/56 to demo reduced fall risk    Baseline 38/56    Time 6    Period Weeks    Status New      PT LONG TERM GOAL #4   Title Patient will improve TUG to </= 12 secs w/ LRAD to demo reduced fall risk    Baseline 16.65 secs    Time 6    Period Weeks    Status New      PT LONG TERM GOAL #5   Title Patient will improve 5x sit <> stand to </= 20 with BUE support    Baseline 27.09    Time 6    Period Weeks    Status New      Additional Long Term Goals   Additional Long Term Goals Yes  PT LONG TERM GOAL #6   Title Patient will be able to ascend/descend stairs with step to pattern and no rails supervision to allow for improved entry/exit into home    Baseline Bilat rails or holding onto family member    Time 6    Period Weeks     Status New                   Plan - 11/26/20 1054     Clinical Impression Statement PT attempted to reassess injury to head due to recent fall but unable to see area. BP slightly elevated. Trialed gait training with R AFO donned with continue to require CGA and cues for improved foot clearance. BP elevated after gait training and remained elevated despite rest  break. Pt reporting no concerning signs/symptoms. PT providing extensive education on monitoring BP at home. Will continue to progress toward all LTGs.    Personal Factors and Comorbidities Comorbidity 3+;Time since onset of injury/illness/exacerbation    Comorbidities Seizures, Stroke, HTN, Ataxia, Thyroid Disease, TBI HLD, Vit D Deficiency    Examination-Activity Limitations Stairs;Stand;Locomotion Level;Transfers    Examination-Participation Restrictions Community Activity    Stability/Clinical Decision Making Stable/Uncomplicated    Rehab Potential Fair    PT Frequency 2x / week    PT Duration 6 weeks    PT Treatment/Interventions ADLs/Self Care Home Management;Aquatic Therapy;Cryotherapy;Electrical Stimulation;Moist Heat;DME Instruction;Gait training;Stair training;Functional mobility training;Contrast Bath;Neuromuscular re-education;Therapeutic activities;Balance training;Patient/family education;Therapeutic exercise;Orthotic Fit/Training;Dry needling;Manual techniques;Passive range of motion;Vestibular;Joint Manipulations    PT Next Visit Plan CHECK BP. Check STGs. Do we have MD name to send order for AFO. Continue gait training with R Thuasne brace with small heel wedge, may need to trial various heights of heel wedge to reduce recurvatum.    Consulted and Agree with Plan of Care Patient             Patient will benefit from skilled therapeutic intervention in order to improve the following deficits and impairments:  Abnormal gait, Decreased balance, Decreased activity tolerance, Decreased coordination, Decreased  safety awareness, Decreased strength, Decreased knowledge of use of DME, Impaired tone, Decreased range of motion, Difficulty walking, Impaired vision/preception  Visit Diagnosis: Muscle weakness (generalized)  Unsteadiness on feet  Difficulty in walking, not elsewhere classified  Other abnormalities of gait and mobility     Problem List There are no problems to display for this patient.   Tempie Donning, PT, DPT 11/26/2020, 11:02 AM  [facility] [address] [place], [place], [address] Phone: [phone]   Fax:  [phone]  Name: Carrie Day MRN: [id number] Date of Birth: [birth year]

## 2020-11-28 ENCOUNTER — Encounter: Payer: Self-pay | Admitting: Physical Therapy

## 2020-11-28 ENCOUNTER — Ambulatory Visit: Payer: Medicaid Other | Admitting: Physical Therapy

## 2020-11-28 ENCOUNTER — Telehealth: Payer: Self-pay | Admitting: Physical Therapy

## 2020-11-28 ENCOUNTER — Other Ambulatory Visit: Payer: Self-pay

## 2020-11-28 VITALS — BP 164/102 | HR 58

## 2020-11-28 DIAGNOSIS — M6281 Muscle weakness (generalized): Secondary | ICD-10-CM

## 2020-11-28 DIAGNOSIS — R2689 Other abnormalities of gait and mobility: Secondary | ICD-10-CM

## 2020-11-28 DIAGNOSIS — R262 Difficulty in walking, not elsewhere classified: Secondary | ICD-10-CM

## 2020-11-28 DIAGNOSIS — R2681 Unsteadiness on feet: Secondary | ICD-10-CM

## 2020-11-28 NOTE — Therapy (Signed)
[facility] [address] [place], [place], [address] Phone: [phone]   Fax:  [phone]  Physical Therapy Treatment  Patient Details  Name: Carrie Day MRN: [id number] Date of Birth: 12-Nov-1975 Referring Provider (PT): Payton Emerald, MD   Encounter Date: 11/28/2020   PT End of Session - 11/28/20 1019     Visit Number 4   no change in visit number   Number of Visits 13    Date for PT Re-Evaluation 12/12/20    Authorization Type [employer] (Auth required after 12 visits; VL: 27)    PT Start Time 1017    PT Stop Time 1030   no charge, BP elevated and session cancelled   PT Time Calculation (min) 13 min    Equipment Utilized During Treatment --    Activity Tolerance Treatment limited secondary to medical complications (Comment)    Behavior During Therapy WFL for tasks assessed/performed             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    Patient is accompained by: Family member   Rob, boy friend   Pertinent History Seizures, Stroke, HTN, Ataxia, Thyroid Disease, TBI HLD, Vit D Deficiency    Currently in Pain? No/denies                      PT Short Term Goals - 10/17/20 1750       PT SHORT TERM GOAL #1   Title Patient will be independent with initial HEP for strength/balance (ALL STGs Due: 11/21/20)    Baseline no HEP established    Time 3    Period Weeks    Status New    Target Date 11/21/20      PT SHORT TERM GOAL #2   Title Patient will improve TUG to </= 15 seconds    Baseline 16.65 secs    Time 3     Period Weeks    Status New      PT SHORT TERM GOAL #3   Title Patient will improve gait speed w/ LRAD to >/= 1.70 ft/sec    Baseline 1.50 ft/sec    Time 3    Period Weeks    Status New               PT Long Term Goals - 10/17/20 1752       PT LONG TERM GOAL #1   Title Patient will be independent with final HEP for strength/balance (ALL LTGs Due: 12/12/20)    Baseline no HEP established    Time 6    Period Weeks    Status New    Target Date 12/12/20      PT LONG TERM GOAL #2   Title Patient will improve gait speed w/ LRAD to >/= 2.0 ft/sec    Baseline 1.5 ft/sec    Time 6    Period Weeks    Status New  PT LONG TERM GOAL #3   Title Patient will improve Berh Balance to >/= 45/56 to demo reduced fall risk    Baseline 38/56    Time 6    Period Weeks    Status New      PT LONG TERM GOAL #4   Title Patient will improve TUG to </= 12 secs w/ LRAD to demo reduced fall risk    Baseline 16.65 secs    Time 6    Period Weeks    Status New      PT LONG TERM GOAL #5   Title Patient will improve 5x sit <> stand to </= 20 with BUE support    Baseline 27.09    Time 6    Period Weeks    Status New      Additional Long Term Goals   Additional Long Term Goals Yes      PT LONG TERM GOAL #6   Title Patient will be able to ascend/descend stairs with step to pattern and no rails supervision to allow for improved entry/exit into home    Baseline Bilat rails or holding onto family member    Time 6    Period Weeks    Status New                   Plan - 11/28/20 1020     Clinical Impression Statement Pt''s BP checked at start of session. Elevated both with automated and manual readings. Pt did state she has BP medication. When asked if she had taken it today, pt unable to recall at first, then stated "no". Pt and boyfriend are going straight home and pt is to take her morning meds at that time. Will plan to check STGs next session if BP in acceptable ranges.     Personal Factors and Comorbidities Comorbidity 3+;Time since onset of injury/illness/exacerbation    Comorbidities Seizures, Stroke, HTN, Ataxia, Thyroid Disease, TBI HLD, Vit D Deficiency    Examination-Activity Limitations Stairs;Stand;Locomotion Level;Transfers    Examination-Participation Restrictions Community Activity    Stability/Clinical Decision Making Stable/Uncomplicated    Rehab Potential Fair    PT Frequency 2x / week    PT Duration 6 weeks    PT Treatment/Interventions ADLs/Self Care Home Management;Aquatic Therapy;Cryotherapy;Electrical Stimulation;Moist Heat;DME Instruction;Gait training;Stair training;Functional mobility training;Contrast Bath;Neuromuscular re-education;Therapeutic activities;Balance training;Patient/family education;Therapeutic exercise;Orthotic Fit/Training;Dry needling;Manual techniques;Passive range of motion;Vestibular;Joint Manipulations    PT Next Visit Plan CHECK BP. Check STGs. Continue gait training with R posterior Thuasne brace with small heel wedge, balance training in brace    Consulted and Agree with Plan of Care Patient             Patient will benefit from skilled therapeutic intervention in order to improve the following deficits and impairments:  Abnormal gait, Decreased balance, Decreased activity tolerance, Decreased coordination, Decreased safety awareness, Decreased strength, Decreased knowledge of use of DME, Impaired tone, Decreased range of motion, Difficulty walking, Impaired vision/preception  Visit Diagnosis: Muscle weakness (generalized)  Unsteadiness on feet  Difficulty in walking, not elsewhere classified  Other abnormalities of gait and mobility     Problem List There are no problems to display for this patient.   Sallyanne Kuster, PTA, [facility] [address] [place], [place] [address] [phone] 11/28/20, 10:36 AM   Name: Carrie Day MRN: [id number] Date of Birth:  [birth year]

## 2020-11-28 NOTE — Telephone Encounter (Signed)
Verdene Lennert, NP,  Mable Paris is being treated by physical therapy for gait abnormalities/decreased balance post TBI.  She will benefit from use of right AFO in order to improve safety with functional mobility.    If you agree, please submit request in EPIC under MD Order, Other Orders (list right AFO in comments) or fax to [facility] at [phone].   Thank you,  Sallyanne Kuster, PTA, [facility] [address] [place], [place] [address] [phone] 11/28/20, 10:40 AM    [facility] [address] [place], [place]  [address] Phone:  [phone] Fax:  [phone]

## 2020-12-03 ENCOUNTER — Ambulatory Visit: Payer: Medicaid Other

## 2020-12-03 ENCOUNTER — Other Ambulatory Visit: Payer: Self-pay

## 2020-12-03 VITALS — BP 132/92

## 2020-12-03 DIAGNOSIS — M6281 Muscle weakness (generalized): Secondary | ICD-10-CM

## 2020-12-03 DIAGNOSIS — R2689 Other abnormalities of gait and mobility: Secondary | ICD-10-CM

## 2020-12-03 NOTE — Therapy (Signed)
[facility] [address] [place], [place], [address] Phone: [phone]   Fax:  [phone]  Physical Therapy Treatment  Patient Details  Name: Carrie Day MRN: [id number] Date of Birth: 10/26/1975 Referring Provider (PT): Vanita Panda, MD   Encounter Date: 12/03/2020   PT End of Session - 12/03/20 1021     Visit Number 5    Number of Visits 13    Date for PT Re-Evaluation 12/12/20    Authorization Type [employer] (Auth required after 12 visits; VL: 27)    PT Start Time 1018    PT Stop Time 1102    PT Time Calculation (min) 44 min    Equipment Utilized During Treatment Gait belt    Activity Tolerance Treatment limited secondary to medical complications (Comment);Patient tolerated treatment well    Behavior During Therapy WFL for tasks assessed/performed             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 Asking about when she will get brace.    Patient is accompained by: Family member   Rob, boy friend   Pertinent History Seizures, Stroke, HTN, Ataxia, Thyroid Disease, TBI HLD, Vit D Deficiency    Currently in Pain? No/denies                               [facility] Adult PT Treatment/Exercise - 12/03/20 1041       Transfers   Transfers Sit to Stand;Stand to Sit    Sit to Stand 5: Supervision    Sit to Stand Details (indicate cue type and reason) Verbal cues to lean forward and not lock push legs back against mat    Stand to Sit 5: Supervision    Stand to Sit Details Verbal cues to bend at hips when going to sit to not fall back for  more control       Ambulation/Gait   Ambulation/Gait Yes    Ambulation/Gait Assistance 4: Min guard    Ambulation/Gait Assistance Details PT provided tactile cues at pelvis to facilitate weight shift on RLE. Pt was given verbal cues to slow down and increase right foot clearance as well. AFO was able to control right knee to prevent recurvatum. Had about 3/8" heel lift in as well.    Ambulation Distance (Feet) 115 Feet    Assistive device None   right posterior Thuasne AFO with   Gait Pattern Step-through pattern;Decreased stance time - right;Decreased step length - left;Decreased hip/knee flexion - right;Decreased weight shift to right    Ambulation Surface Level;Indoor    Gait velocity 18.2 sec=0.45ms or 1.895fsec    Gait Comments BP=138/100 at end of session      Standardized Balance Assessment   Standardized Balance Assessment Timed Up and Go Test      Timed Up and Go Test   TUG Normal TUG    Normal TUG (seconds) 22.5   25 and 20 sec     Neuro Re-ed  Neuro Re-ed Details  Sit to stands x 8 with cues for form without UE support. Pt was cued to lean forward both with ascent and descent to prevent falling back. Standing in corner to review corner exercises from HEP: standing with feet together x 30 sec, feet apart with eyes closed x 30 sec, tandem stance x 30 sec each position. Pt tends to put more weight on LLE. Tapping cone with occasional UE support x 10 left foot to facilitate right weight shift and then x 10 with RLE with cues to really pick up foot.  All of the above had right AFO donned. Step-ups RLE with PT controlling at right knee as brace removed x 10                     PT Education - 12/03/20 1341     Education Details PT let pt and significant other know that order request for AFO was sent on 9/8.    Person(s) Educated Patient    Methods Explanation    Comprehension Verbalized understanding              PT Short Term Goals - 12/03/20 1334       PT SHORT TERM GOAL #1    Title Patient will be independent with initial HEP for strength/balance (ALL STGs Due: 11/21/20)    Baseline Pt reported being familiar with sit to stands and tapping but had not been doing the corner exercises and needed cuing.    Time 3    Period Weeks    Status Partially Met    Target Date 11/21/20      PT SHORT TERM GOAL #2   Title Patient will improve TUG to </= 15 seconds    Baseline 16.65 secs. 12/03/20 22.5 sec    Time 3    Period Weeks    Status Not Met      PT SHORT TERM GOAL #3   Title Patient will improve gait speed w/ LRAD to >/= 1.70 ft/sec    Baseline 1.50 ft/sec. 12/03/20 1.63f/sec    Time 3    Period Weeks    Status Achieved               PT Long Term Goals - 10/17/20 1752       PT LONG TERM GOAL #1   Title Patient will be independent with final HEP for strength/balance (ALL LTGs Due: 12/12/20)    Baseline no HEP established    Time 6    Period Weeks    Status New    Target Date 12/12/20      PT LONG TERM GOAL #2   Title Patient will improve gait speed w/ LRAD to >/= 2.0 ft/sec    Baseline 1.5 ft/sec    Time 6    Period Weeks    Status New      PT LONG TERM GOAL #3   Title Patient will improve Berh Balance to >/= 45/56 to demo reduced fall risk    Baseline 38/56    Time 6    Period Weeks    Status New      PT LONG TERM GOAL #4   Title Patient will improve TUG to </= 12 secs w/ LRAD to demo reduced fall risk    Baseline 16.65 secs    Time 6    Period Weeks    Status New      PT LONG TERM GOAL #5  Title Patient will improve 5x sit <> stand to </= 20 with BUE support    Baseline 27.09    Time 6    Period Weeks    Status New      Additional Long Term Goals   Additional Long Term Goals Yes      PT LONG TERM GOAL #6   Title Patient will be able to ascend/descend stairs with step to pattern and no rails supervision to allow for improved entry/exit into home    Baseline Bilat rails or holding onto family member    Time 6    Period Weeks     Status New                   Plan - 12/03/20 1336     Clinical Impression Statement Pt's BP was better than prior visit but did increase especially with diastolic after activity. PT checked STGs. Pt partially met initial HEP goal but needed reminders/cuing for corner balance. She was not able to improve on TUG and still fall risk. Gait speed did improve meeting goal with use of right AFO. Pt was also able to show improved right weight shift with use of AFO which helped correct recurvatum as well.    Personal Factors and Comorbidities Comorbidity 3+;Time since onset of injury/illness/exacerbation    Comorbidities Seizures, Stroke, HTN, Ataxia, Thyroid Disease, TBI HLD, Vit D Deficiency    Examination-Activity Limitations Stairs;Stand;Locomotion Level;Transfers    Examination-Participation Restrictions Community Activity    Stability/Clinical Decision Making Stable/Uncomplicated    Rehab Potential Fair    PT Frequency 2x / week    PT Duration 6 weeks    PT Treatment/Interventions ADLs/Self Care Home Management;Aquatic Therapy;Cryotherapy;Electrical Stimulation;Moist Heat;DME Instruction;Gait training;Stair training;Functional mobility training;Contrast Bath;Neuromuscular re-education;Therapeutic activities;Balance training;Patient/family education;Therapeutic exercise;Orthotic Fit/Training;Dry needling;Manual techniques;Passive range of motion;Vestibular;Joint Manipulations    PT Next Visit Plan CHECK BP. Continue gait training with R posterior Thuasne brace with small heel wedge (3/8" was used last visit), balance training in brace, working on weight shifting over RLE with AFO.    Consulted and Agree with Plan of Care Patient             Patient will benefit from skilled therapeutic intervention in order to improve the following deficits and impairments:  Abnormal gait, Decreased balance, Decreased activity tolerance, Decreased coordination, Decreased safety awareness, Decreased  strength, Decreased knowledge of use of DME, Impaired tone, Decreased range of motion, Difficulty walking, Impaired vision/preception  Visit Diagnosis: Other abnormalities of gait and mobility  Muscle weakness (generalized)     Problem List There are no problems to display for this patient.   Electa Sniff, PT, DPT, NCS 12/03/2020, 1:42 PM  [facility] [address] [place], [place], [address] Phone: [phone]   Fax:  [phone]  Name: ANJANETTE GILKEY MRN: [id number] Date of Birth: 24-Aug-1975

## 2020-12-05 ENCOUNTER — Other Ambulatory Visit: Payer: Self-pay

## 2020-12-05 ENCOUNTER — Encounter: Payer: Self-pay | Admitting: Physical Therapy

## 2020-12-05 ENCOUNTER — Ambulatory Visit: Payer: Medicaid Other | Admitting: Physical Therapy

## 2020-12-05 VITALS — BP 150/91 | HR 55

## 2020-12-05 DIAGNOSIS — R262 Difficulty in walking, not elsewhere classified: Secondary | ICD-10-CM

## 2020-12-05 DIAGNOSIS — R2681 Unsteadiness on feet: Secondary | ICD-10-CM

## 2020-12-05 DIAGNOSIS — R2689 Other abnormalities of gait and mobility: Secondary | ICD-10-CM

## 2020-12-05 DIAGNOSIS — M6281 Muscle weakness (generalized): Secondary | ICD-10-CM | POA: Diagnosis not present

## 2020-12-08 NOTE — Therapy (Signed)
[facility] [address] [place], [place], [address] Phone: [phone]   Fax:  [phone]  Physical Therapy Treatment  Patient Details  Name: Carrie Day MRN: [id number] Date of Birth: December 22, 1975 Referring Provider (PT): Vanita Panda, MD   Encounter Date: 12/05/2020    12/05/20 1021  PT Visits / Re-Eval  Visit Number 6  Number of Visits 13  Date for PT Re-Evaluation 12/12/20  Authorization  Authorization Type [employer] (Auth required after 12 visits; VL: 27)  PT Time Calculation  PT Start Time 1018  PT Stop Time 1100  PT Time Calculation (min) 42 min  PT - End of Session  Equipment Utilized During Treatment Gait belt  Activity Tolerance Patient tolerated treatment well  Behavior During Therapy WFL for tasks assessed/performed    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 "I hope my BP is lower, I think that is why my balance is bad'. No falls or pain to report.  Patient is accompained by: Family member (Rob, boyfriend)  Pertinent History Seizures, Stroke, HTN, Ataxia, Thyroid Disease, TBI HLD, Vit D Deficiency  Limitations Standing;Walking;House hold activities  Patient Stated Goals Walk down stairs without holding onto something/somebody; be able to jump  Pain Assessment  Currently in Pain? No/denies       12/05/20 1031  Transfers  Transfers Sit to Stand;Stand to Sit  Sit to Stand 5: Supervision;From bed;From chair/3-in-1  Stand to Sit 5: Supervision;To bed;To  chair/3-in-1  Ambulation/Gait  Ambulation/Gait Yes  Ambulation/Gait Assistance 4: Min guard  Ambulation/Gait Assistance Details cues for equal step length and stance time with PTA providing faciliation at pelvis for weight shifting. cues for heel>toe step progression on right LE and relaxed right UE with arm swing.  Ambulation Distance (Feet) 230 Feet (x1)  Assistive device None;Other (Comment)  Gait Pattern Step-through pattern;Decreased stance time - right;Decreased step length - left;Decreased hip/knee flexion - right;Decreased weight shift to right  Ambulation Surface Level;Indoor  Neuro Re-ed   Neuro Re-ed Details  for balance/muscle re-ed: standing on solid floor for alternating heel taps to 4 inch box for 10 reps each side, min assist with cues for weight shifting and hip/knee flexion, no UE support; then use of floor ladder working on step length, heel strike and hip/knee flexion for several laps with min guard to min HHA.          PT Short Term Goals - 12/03/20 1334       PT SHORT TERM GOAL #1   Title Patient will be independent with initial HEP for strength/balance (ALL STGs Due: 11/21/20)    Baseline Pt reported being familiar with sit to stands and tapping but had not been doing the corner exercises and needed cuing.    Time 3    Period Weeks    Status Partially Met    Target Date 11/21/20      PT SHORT TERM GOAL #2   Title  Patient will improve TUG to </= 15 seconds    Baseline 16.65 secs. 12/03/20 22.5 sec    Time 3    Period Weeks    Status Not Met      PT SHORT TERM GOAL #3   Title Patient will improve gait speed w/ LRAD to >/= 1.70 ft/sec    Baseline 1.50 ft/sec. 12/03/20 1.45f/sec    Time 3    Period Weeks    Status Achieved               PT Long Term Goals - 10/17/20 1752       PT LONG TERM GOAL #1   Title Patient will be independent with final HEP for strength/balance (ALL LTGs Due: 12/12/20)    Baseline no HEP established    Time 6    Period  Weeks    Status New    Target Date 12/12/20      PT LONG TERM GOAL #2   Title Patient will improve gait speed w/ LRAD to >/= 2.0 ft/sec    Baseline 1.5 ft/sec    Time 6    Period Weeks    Status New      PT LONG TERM GOAL #3   Title Patient will improve Berh Balance to >/= 45/56 to demo reduced fall risk    Baseline 38/56    Time 6    Period Weeks    Status New      PT LONG TERM GOAL #4   Title Patient will improve TUG to </= 12 secs w/ LRAD to demo reduced fall risk    Baseline 16.65 secs    Time 6    Period Weeks    Status New      PT LONG TERM GOAL #5   Title Patient will improve 5x sit <> stand to </= 20 with BUE support    Baseline 27.09    Time 6    Period Weeks    Status New      Additional Long Term Goals   Additional Long Term Goals Yes      PT LONG TERM GOAL #6   Title Patient will be able to ascend/descend stairs with step to pattern and no rails supervision to allow for improved entry/exit into home    Baseline Bilat rails or holding onto family member    Time 6    Period Weeks    Status New              12/05/20 1022  Plan  Clinical Impression Statement Today's skilled session continued with use of clinic AFO with gait and balance training with rest breaks taken as needed. Pt's BP did elevated with activity this session, decreased with rest breaks. The pt is making progressing toward goals and should benefit from continued PT to progress toward unmet goals.  Personal Factors and Comorbidities Comorbidity 3+;Time since onset of injury/illness/exacerbation  Comorbidities Seizures, Stroke, HTN, Ataxia, Thyroid Disease, TBI HLD, Vit D Deficiency  Examination-Activity Limitations Stairs;Stand;Locomotion Level;Transfers  Examination-Participation Restrictions Community Activity  Pt will benefit from skilled therapeutic intervention in order to improve on the following deficits Abnormal gait;Decreased balance;Decreased activity tolerance;Decreased  coordination;Decreased safety awareness;Decreased strength;Decreased knowledge of use of DME;Impaired tone;Decreased range of motion;Difficulty walking;Impaired vision/preception  Stability/Clinical Decision Making Stable/Uncomplicated  Rehab Potential Fair  PT Frequency 2x / week  PT Duration 6 weeks  PT Treatment/Interventions ADLs/Self Care Home Management;Aquatic Therapy;Cryotherapy;Electrical Stimulation;Moist Heat;DME Instruction;Gait training;Stair training;Functional mobility training;Contrast Bath;Neuromuscular re-education;Therapeutic activities;Balance  training;Patient/family education;Therapeutic exercise;Orthotic Fit/Training;Dry needling;Manual techniques;Passive range of motion;Vestibular;Joint Manipulations  PT Next Visit Plan CHECK BP. Continue gait training with R posterior Thuasne brace with small heel wedge (3/8" was used last visit), balance training in brace, working on weight shifting over RLE with AFO.  Consulted and Agree with Plan of Care Patient          Patient will benefit from skilled therapeutic intervention in order to improve the following deficits and impairments:  Abnormal gait, Decreased balance, Decreased activity tolerance, Decreased coordination, Decreased safety awareness, Decreased strength, Decreased knowledge of use of DME, Impaired tone, Decreased range of motion, Difficulty walking, Impaired vision/preception  Visit Diagnosis: Other abnormalities of gait and mobility  Muscle weakness (generalized)  Unsteadiness on feet  Difficulty in walking, not elsewhere classified     Problem List There are no problems to display for this patient.   Willow Ora, PTA 12/08/2020, 6:09 PM  [facility] [address] [place], [place], [address] Phone: [phone]   Fax:  [phone]  Name: HEVIN JEFFCOAT MRN: [id number] Date of Birth: April 01, 1975

## 2020-12-10 ENCOUNTER — Ambulatory Visit: Payer: Medicaid Other

## 2020-12-10 ENCOUNTER — Other Ambulatory Visit: Payer: Self-pay

## 2020-12-10 VITALS — BP 137/88 | HR 55

## 2020-12-10 DIAGNOSIS — M6281 Muscle weakness (generalized): Secondary | ICD-10-CM | POA: Diagnosis not present

## 2020-12-10 DIAGNOSIS — R2681 Unsteadiness on feet: Secondary | ICD-10-CM

## 2020-12-10 DIAGNOSIS — R262 Difficulty in walking, not elsewhere classified: Secondary | ICD-10-CM

## 2020-12-10 DIAGNOSIS — R2689 Other abnormalities of gait and mobility: Secondary | ICD-10-CM

## 2020-12-10 NOTE — Patient Instructions (Signed)
Carrie Lennert, NP,   Carrie Day is being treated by physical therapy for gait abnormalities/decreased balance post TBI.  She will benefit from use of right AFO in order to improve safety with functional mobility.     If you agree, please submit request in EPIC under MD Order, Other Orders (list right AFO in comments) or fax to [facility] at [phone].    Thank you,   Ivy Meriwether B. Arsenio Katz, DPT [facility] [address] [place], [place] [address] [phone] 11/28/20, 10:40 AM      [facility]  [address] [place], [place]  [address] Phone:  [phone] Fax:  [phone]

## 2020-12-10 NOTE — Therapy (Signed)
[facility] [address] [place], [place], [address] Phone: [phone]   Fax:  [phone]  Physical Therapy Treatment/Re-Certification  Patient Details  Name: Carrie Day MRN: [id number] Date of Birth: [birth year] Referring Provider (PT): Vanita Panda, MD   Encounter Date: 12/10/2020   PT End of Session - 12/10/20 1012     Visit Number 7    Number of Visits 13    Date for PT Re-Evaluation 01/24/21    Authorization Type [employer] (Auth required after 12 visits; VL: 66)    PT Start Time 1013    PT Stop Time 1057    PT Time Calculation (min) 44 min    Equipment Utilized During Treatment Gait belt    Activity Tolerance Patient tolerated treatment well    Behavior During Therapy WFL for tasks assessed/performed             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 No pain. Asking if we have recieved order from MD regarding brace. No falls.    Patient is accompained by: Family member   Rob, boyfriend   Pertinent History Seizures, Stroke, HTN, Ataxia, Thyroid Disease, TBI HLD, Vit D Deficiency    Limitations Standing;Walking;House hold activities    Patient Stated Goals Walk down stairs without holding onto something/somebody; be able to jump    Currently in Pain? No/denies                [facility] PT Assessment - 12/10/20 0001       Assessment   Medical Diagnosis TBI/CVA    Referring Provider (PT) Vanita Panda, MD             [facility] Adult PT Treatment/Exercise - 12/10/20 0001       Transfers   Transfers Sit to Stand;Stand to Sit     Sit to Stand 5: Supervision;With upper extremity assist;From chair/3-in-1    Five time sit to stand comments  19.50 secs with BUE support from standard height chair    Stand to Sit 5: Supervision;To chair/3-in-1      Ambulation/Gait   Ambulation/Gait Yes    Ambulation/Gait Assistance 5: Supervision;4: Min guard    Ambulation/Gait Assistance Details all ambulation throughout therapy gym completed with R Thuasne AFO donned with small heel wedge (on brooke's desk)    Ambulation Distance (Feet) --   clinic distance   Assistive device None    Gait Pattern Step-through pattern;Decreased stance time - right;Decreased step length - left;Decreased hip/knee flexion - right;Decreased weight shift to right    Ambulation Surface Level;Indoor    Gait velocity 15.09 secs = 2.17 ft/sec   with use of R Thuasne Brace   Stairs Yes    Stairs Assistance 4: Min guard    Stairs Assistance Details (indicate cue type and reason) Single rail on R Side, with recirpocal pattern    Stair Management Technique One rail Right;Alternating pattern;Forwards    Number of Stairs 8    Height of Stairs 6  Standardized Balance Assessment   Standardized Balance Assessment Berg Balance Test;Timed Up and Go Test      Berg Balance Test   Sit to Stand Able to stand  independently using hands    Standing Unsupported Able to stand safely 2 minutes    Sitting with Back Unsupported but Feet Supported on Floor or Stool Able to sit safely and securely 2 minutes    Stand to Sit Controls descent by using hands    Transfers Able to transfer safely, definite need of hands    Standing Unsupported with Eyes Closed Able to stand 10 seconds with supervision    Standing Ubsupported with Feet Together Able to place feet together independently and stand for 1 minute with supervision    From Standing, Reach Forward with Outstretched Arm Can reach forward >12 cm safely (5")    From Standing Position, Pick up Object from Floor Able to pick up  shoe, needs supervision    From Standing Position, Turn to Look Behind Over each Shoulder Looks behind one side only/other side shows less weight shift    Turn 360 Degrees Able to turn 360 degrees safely but slowly    Standing Unsupported, Alternately Place Feet on Step/Stool Able to complete >2 steps/needs minimal assist    Standing Unsupported, One Foot in Front Able to take small step independently and hold 30 seconds    Standing on One Leg Tries to lift leg/unable to hold 3 seconds but remains standing independently    Total Score 38      Timed Up and Go Test   TUG Normal TUG    Normal TUG (seconds) 16.2   with R AFO donned             PT Education - 12/10/20 1021     Education Details Progress toward LTGs; Handout to provide to MD regarding AFO    Person(s) Educated Patient    Methods Explanation    Comprehension Verbalized understanding              PT Short Term Goals - 12/03/20 1334       PT SHORT TERM GOAL #1   Title Patient will be independent with initial HEP for strength/balance (ALL STGs Due: 11/21/20)    Baseline Pt reported being familiar with sit to stands and tapping but had not been doing the corner exercises and needed cuing.    Time 3    Period Weeks    Status Partially Met    Target Date 11/21/20      PT SHORT TERM GOAL #2   Title Patient will improve TUG to </= 15 seconds    Baseline 16.65 secs. 12/03/20 22.5 sec    Time 3    Period Weeks    Status Not Met      PT SHORT TERM GOAL #3   Title Patient will improve gait speed w/ LRAD to >/= 1.70 ft/sec    Baseline 1.50 ft/sec. 12/03/20 1.65f/sec    Time 3    Period Weeks    Status Achieved               PT Long Term Goals - 12/10/20 1029       PT LONG TERM GOAL #1   Title Patient will be independent with final HEP for strength/balance (ALL LTGs Due: 12/12/20)    Baseline no HEP established; reports independence will continue to benefit from progressive HEP    Time 6    Period Weeks  Status On-going      PT LONG TERM GOAL #2   Title Patient will improve gait speed w/ LRAD to >/= 2.0 ft/sec    Baseline 1.5 ft/sec; 2.17 ft/sec    Time 6    Period Weeks    Status Achieved      PT LONG TERM GOAL #3   Title Patient will improve Berh Balance to >/= 45/56 to demo reduced fall risk    Baseline 38/56; 38/56    Time 6    Period Weeks    Status Not Met      PT LONG TERM GOAL #4   Title Patient will improve TUG to </= 12 secs w/ LRAD to demo reduced fall risk    Baseline 16.65 secs; 16.2 seconds    Time 6    Period Weeks    Status Not Met      PT LONG TERM GOAL #5   Title Patient will improve 5x sit <> stand to </= 20 with BUE support    Baseline 27.09; 19.5 seconds    Time 6    Period Weeks    Status Achieved      PT LONG TERM GOAL #6   Title Patient will be able to ascend/descend stairs with step to pattern and no rails supervision to allow for improved entry/exit into home    Baseline Bilat rails or holding onto family member; single rail on R with CGA    Time 6    Period Weeks    Status Not Met            Updated Short Term Goals:   PT Short Term Goals - 12/10/20 1335       PT SHORT TERM GOAL #1   Title Patient will be recieve AFO and report understanding on wear schedule    Baseline use of clinical AFO    Time 3    Period Weeks    Status New    Target Date 01/03/21      PT SHORT TERM GOAL #2   Title Patient will be able to ambulate >/= 300 ft outdoors supervision level with AFO and no AD    Baseline TBA    Time 3    Period Weeks    Status New            Updated Long Term Goals:   PT Long Term Goals - 12/10/20 1337       PT LONG TERM GOAL #1   Title Patient will be independent with final HEP for strength/balance (ALL LTGs Due: 01/24/21)    Baseline no HEP established; reports independence will continue to benefit from progressive HEP    Time 6    Period Weeks    Status On-going    Target Date 01/24/21      PT LONG TERM GOAL  #2   Title Patient will improve gait speed w/ LRAD to >/= 2.5 ft/sec    Baseline 1.5 ft/sec; 2.17 ft/sec    Time 6    Period Weeks    Status Revised      PT LONG TERM GOAL #3   Title Patient will improve Berh Balance to >/= 45/56 to demo reduced fall risk    Baseline 38/56; 38/56    Time 6    Period Weeks    Status On-going      PT LONG TERM GOAL #4   Title Patient will improve TUG to </= 12 secs w/ LRAD to  demo reduced fall risk    Baseline 16.65 secs; 16.2 seconds    Time 6    Period Weeks    Status On-going      PT LONG TERM GOAL #5   Title Patient will improve 5x sit <> stand to </= 15 with BUE support    Baseline 27.09; 19.5 seconds    Time 6    Period Weeks    Status Revised      PT LONG TERM GOAL #6   Title Patient will be able to ascend/descend stairs with step to pattern and no rails supervision to allow for improved entry/exit into home    Baseline Bilat rails or holding onto family member; single rail on R with CGA    Time 6    Period Weeks    Status On-going                   Plan - 12/10/20 1200     Clinical Impression Statement Completed assesment of patient's progress toward LTG. All goals assessed with clinic AFO. Have not recieved order for AFO at this time. PT following up withPCP. Patient able to meet LTG #2, and #5 today. patient made progress toward all other LTG but not to goal level at this time. Will continue to benefit from skilled PT services to progress toward all LTGs.    Personal Factors and Comorbidities Comorbidity 3+;Time since onset of injury/illness/exacerbation    Comorbidities Seizures, Stroke, HTN, Ataxia, Thyroid Disease, TBI HLD, Vit D Deficiency    Examination-Activity Limitations Stairs;Stand;Locomotion Level;Transfers    Examination-Participation Restrictions Community Activity    Stability/Clinical Decision Making Stable/Uncomplicated    Rehab Potential Fair    PT Frequency 1x / week    PT Duration 6 weeks    PT  Treatment/Interventions ADLs/Self Care Home Management;Aquatic Therapy;Cryotherapy;Electrical Stimulation;Moist Heat;DME Instruction;Gait training;Stair training;Functional mobility training;Contrast Bath;Neuromuscular re-education;Therapeutic activities;Balance training;Patient/family education;Therapeutic exercise;Orthotic Fit/Training;Dry needling;Manual techniques;Passive range of motion;Vestibular;Joint Manipulations    PT Next Visit Plan CHECK BP. Continue gait training with R posterior Thuasne brace with small heel wedge (3/8" was used last visit), balance training in brace, working on weight shifting over RLE with AFO.    Consulted and Agree with Plan of Care Patient             Patient will benefit from skilled therapeutic intervention in order to improve the following deficits and impairments:  Abnormal gait, Decreased balance, Decreased activity tolerance, Decreased coordination, Decreased safety awareness, Decreased strength, Decreased knowledge of use of DME, Impaired tone, Decreased range of motion, Difficulty walking, Impaired vision/preception  Visit Diagnosis: Other abnormalities of gait and mobility  Muscle weakness (generalized)  Unsteadiness on feet  Difficulty in walking, not elsewhere classified     Problem List There are no problems to display for this patient.   Jones Bales, PT, DPT 12/10/2020, 1:34 PM  [facility] [address] [place], [place], [address] Phone: [phone]   Fax:  [phone]  Name: Carrie Day MRN: [id number] Date of Birth: [birth year]

## 2020-12-12 ENCOUNTER — Other Ambulatory Visit: Payer: Self-pay

## 2020-12-12 ENCOUNTER — Encounter (HOSPITAL_BASED_OUTPATIENT_CLINIC_OR_DEPARTMENT_OTHER): Payer: Self-pay | Admitting: *Deleted

## 2020-12-12 ENCOUNTER — Emergency Department (HOSPITAL_BASED_OUTPATIENT_CLINIC_OR_DEPARTMENT_OTHER)
Admission: EM | Admit: 2020-12-12 | Discharge: 2020-12-12 | Disposition: A | Discharge status: Home / Self Care | Payer: Medicaid Other | Attending: Emergency Medicine | Admitting: Emergency Medicine

## 2020-12-12 ENCOUNTER — Ambulatory Visit: Payer: Medicaid Other

## 2020-12-12 ENCOUNTER — Emergency Department (HOSPITAL_BASED_OUTPATIENT_CLINIC_OR_DEPARTMENT_OTHER): Payer: Medicaid Other

## 2020-12-12 DIAGNOSIS — S0011XA Contusion of right eyelid and periocular area, initial encounter: Secondary | ICD-10-CM | POA: Diagnosis not present

## 2020-12-12 DIAGNOSIS — S80212A Abrasion, left knee, initial encounter: Secondary | ICD-10-CM | POA: Insufficient documentation

## 2020-12-12 DIAGNOSIS — W01198A Fall on same level from slipping, tripping and stumbling with subsequent striking against other object, initial encounter: Secondary | ICD-10-CM | POA: Insufficient documentation

## 2020-12-12 DIAGNOSIS — Z79899 Other long term (current) drug therapy: Secondary | ICD-10-CM | POA: Insufficient documentation

## 2020-12-12 DIAGNOSIS — F17210 Nicotine dependence, cigarettes, uncomplicated: Secondary | ICD-10-CM | POA: Diagnosis not present

## 2020-12-12 DIAGNOSIS — S0081XA Abrasion of other part of head, initial encounter: Secondary | ICD-10-CM | POA: Diagnosis not present

## 2020-12-12 DIAGNOSIS — S0993XA Unspecified injury of face, initial encounter: Secondary | ICD-10-CM | POA: Diagnosis present

## 2020-12-12 DIAGNOSIS — I10 Essential (primary) hypertension: Secondary | ICD-10-CM | POA: Insufficient documentation

## 2020-12-12 DIAGNOSIS — S0083XA Contusion of other part of head, initial encounter: Secondary | ICD-10-CM | POA: Insufficient documentation

## 2020-12-12 DIAGNOSIS — S80211A Abrasion, right knee, initial encounter: Secondary | ICD-10-CM | POA: Insufficient documentation

## 2020-12-12 NOTE — ED Notes (Signed)
MD to triage for MSE

## 2020-12-12 NOTE — ED Provider Notes (Signed)
Emergency Medicine Provider Triage Evaluation Note  Carrie Day , a 45 y.o. female  was evaluated in triage.  Pt complains of fall, facial trauma.  Review of Systems  Positive: fall Negative: LOC  Physical Exam  BP (!) 155/100   Pulse 60   Temp 99.1 F (37.3 C) (Oral)   Resp 16   Ht 5\' 3"  (1.6 m)   Wt 59 kg   LMP 09/06/2014   SpO2 99%   BMI 23.03 kg/m  Gen:   Awake, no distress   Resp:  Normal effort  MSK:   Moves extremities without difficulty, abrasion to knees, but no swelling Other:  Facial abrasions, swelling to left forehead  Medical Decision Making  Medically screening exam initiated at 6:33 PM.  Appropriate orders placed.  Carrie Day was informed that the remainder of the evaluation will be completed by another provider, this initial triage assessment does not replace that evaluation, and the importance of remaining in the ED until their evaluation is complete.  Mechanical fall, denies LOC, trauma to face, will check CT imaging   Carrie Cross, MD 12/12/20 [phone]

## 2020-12-12 NOTE — ED Provider Notes (Signed)
[facility] EMERGENCY DEPARTMENT Provider Note   CSN: [id number] Arrival date & time: 12/12/20  1809     History Chief Complaint  Patient presents with   Carrie Day is a 45 y.o. female.  Presents to ER after fall.  Patient reports that she fell about 1 hour prior to arrival, tripped over something and struck her head.  Did not lose consciousness.  Did not feel lightheaded.  Suffered an abrasion to her right cheek and right forehead/eyebrow.  Also suffered abrasions to her knees.  Not having ongoing pain in her knees and has been able to walk since the incident.  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   Past Surgical History:  Procedure Laterality Date   SPLENECTOMY       OB History   No obstetric history on file.     No family history on file.  Social History   Tobacco Use   Smoking status: Every Day    Types: Cigarettes   Smokeless tobacco: Never   Tobacco comments:    3-4 cigs/day  Vaping Use   Vaping Use: Never used  Substance Use Topics   Alcohol use: Yes    Comment: occ   Drug use: Yes    Types: Marijuana    Home Medications Prior to Admission medications   Medication Sig Start Date End Date Taking? Authorizing Provider  atorvastatin (LIPITOR) 10 MG tablet Take 10 mg by mouth daily.   Yes [provider]  nystatin cream (MYCOSTATIN) Apply 1 application topically 2 (two) times daily.   Yes [provider]  amLODipine (NORVASC) 10 MG tablet Take 1 tablet (10 mg total) by mouth daily. 04/04/20 09/30/20  Sabino Donovan, MD  cholecalciferol (VITAMIN D3) 25 MCG (1000 UNIT) tablet Take 1,000 Units by mouth daily.    [provider]  clotrimazole (LOTRIMIN)  1 % cream Apply 1 application topically 2 (two) times daily. 09/30/20   Brock Bad, MD  levothyroxine (SYNTHROID) 100 MCG tablet Take 100 mcg by mouth daily before breakfast.    [provider]  metroNIDAZOLE (FLAGYL) 500 MG tablet Take 1 tablet (500 mg total) by mouth 2 (two) times daily. 10/01/20   Brock Bad, MD  phenytoin (DILANTIN) 100 MG ER capsule Take 1 capsule (100 mg total) by mouth 3 (three) times daily. 04/04/20 09/30/20  Sabino Donovan, MD    Allergies    Penicillins  Review of Systems   Review of Systems  Musculoskeletal:  Negative for arthralgias, back pain, joint swelling and neck pain.  All other systems reviewed and are negative.  Physical Exam Updated Vital Signs BP (!) 147/98 (BP Location: Left Arm)   Pulse (!) 57   Temp 99.1 F (37.3 C) (Oral)   Resp 16   Ht 5\' 3"  (1.6 m)   Wt 59 kg   LMP 09/06/2014   SpO2 96%   BMI 23.03 kg/m   Physical Exam Vitals and nursing note reviewed.  Constitutional:      General: She is not in acute distress.    Appearance: She is well-developed.  HENT:  Head: Normocephalic.     Comments: Hematoma noted to the right eyebrow and overlying superficial abrasion, no active bleeding noted, superficial abrasion to right cheek    Nose: Nose normal.  Eyes:     Conjunctiva/sclera: Conjunctivae normal.  Cardiovascular:     Rate and Rhythm: Normal rate and regular rhythm.     Heart sounds: No murmur heard. Pulmonary:     Effort: Pulmonary effort is normal. No respiratory distress.     Breath sounds: Normal breath sounds.  Abdominal:     Palpations: Abdomen is soft.     Tenderness: There is no abdominal tenderness.  Musculoskeletal:     Cervical back: Normal range of motion and neck supple. No rigidity.     Comments: Back: no C, T, L spine TTP, no step off or deformity RUE: no TTP throughout, no deformity, normal joint ROM, radial pulse intact, distal sensation and motor intact LUE: no TTP throughout, no  deformity, normal joint ROM, radial pulse intact, distal sensation and motor intact RLE:  small abrasion to knee, no TTP throughout, no deformity, normal joint ROM, distal pulse, sensation and motor intact LLE: small abrasion to knee, no TTP throughout, no deformity, normal joint ROM, distal pulse, sensation and motor intact  Skin:    General: Skin is warm and dry.  Neurological:     Mental Status: She is alert.    ED Results / Procedures / Treatments   Labs (all labs ordered are listed, but only abnormal results are displayed) Labs Reviewed - No data to display  EKG None  Radiology CT HEAD WO CONTRAST ( )  Result Date: 12/12/2020 CLINICAL DATA:  Head trauma, mod-severe EXAM: CT HEAD WITHOUT CONTRAST TECHNIQUE: Contiguous axial images were obtained from the base of the skull through the vertex without intravenous contrast. COMPARISON:  Remote head CT 03/14/2005 FINDINGS: Brain: No acute intracranial hemorrhage. No subdural hematoma or extra-axial collection. Extensive encephalomalacia in the left cerebral hemisphere. Smaller areas of encephalomalacia in the right frontal and parietooccipital lobes. Small infarct in the left superior cerebellum is new from 2006 but remote. No evidence of acute ischemia. There is ex vacuo dilatation of the left lateral ventricle. Cavum septum pellucidum, normal variant. Despite the degree of encephalomalacia there is no midline shift. Vascular: No hyperdense vessel or unexpected calcification. Skull: No fracture or focal lesion. Sinuses/Orbits: Right supraorbital scalp hematoma and laceration, assessed on concurrent face CT, reported separately Other: None. IMPRESSION: 1. Right supraorbital scalp hematoma and laceration, assessed on concurrent face CT, reported separately. 2. No acute intracranial abnormality. No skull fracture. 3. Extensive encephalomalacia in the left cerebral hemisphere with ex vacuo dilatation of the left lateral ventricle. Smaller areas of  encephalomalacia in the right frontal and parietooccipital lobes. Small infarct in the left superior cerebellum is new from 2006 but remote. Electronically Signed   By: Narda Rutherford M.D.   On: 12/12/2020 19:29   CT Cervical Spine Wo Contrast  Result Date: 12/12/2020 CLINICAL DATA:  Neck trauma. EXAM: CT CERVICAL SPINE WITHOUT CONTRAST TECHNIQUE: Multidetector CT imaging of the cervical spine was performed without intravenous contrast. Multiplanar CT image reconstructions were also generated. COMPARISON:  None. FINDINGS: Alignment: No acute subluxation. Skull base and vertebrae: No acute fracture. Soft tissues and spinal canal: No prevertebral fluid or swelling. No visible canal hematoma. Disc levels: Multilevel degenerative changes with disc space narrowing and endplate irregularity and anterior spurring and osteophyte most prominent at C3-C6. Upper chest: Negative. Other: None IMPRESSION: 1. No acute/traumatic cervical spine pathology.  2. Multilevel degenerative changes. Electronically Signed   By: Elgie Collard M.D.   On: 12/12/2020 19:28   CT Maxillofacial Wo Contrast  Result Date: 12/12/2020 CLINICAL DATA:  Facial trauma EXAM: CT MAXILLOFACIAL WITHOUT CONTRAST TECHNIQUE: Multidetector CT imaging of the maxillofacial structures was performed. Multiplanar CT image reconstructions were also generated. COMPARISON:  None. FINDINGS: Osseous: No acute fracture of the nasal bone, zygomatic arches, or mandibles. The temporomandibular joints are congruent. Nasal septum is midline. Orbits: No acute orbital fracture or globe injury. Sinuses: No sinus fracture or fluid levels. Paranasal sinuses are clear. There is no mastoid effusion. Soft tissues: Right supraorbital scalp hematoma and laceration. Limited intracranial: Assessed on concurrent head CT, reported separately. IMPRESSION: Right supraorbital scalp hematoma and laceration. No acute orbital or facial bone fracture. Electronically Signed   By: Narda Rutherford M.D.   On: 12/12/2020 19:31    Procedures Procedures   Medications Ordered in ED Medications - No data to display  ED Course  I have reviewed the triage vital signs and the nursing notes.  Pertinent labs & imaging results that were available during my care of the patient were reviewed by me and considered in my medical decision making (see chart for details).    MDM Rules/Calculators/A&P                           45 year old lady presents to ER with concern for fall, head and face trauma.  On exam noted to have hematoma to right forehead/right eyebrow region and abrasions to the right face.  Eye appears normal and unaffected.  CT imaging negative for acute traumatic pathology.  Minor abrasion to knees but no focal bony tenderness and ambulating.  On reassessment patient remains well-appearing. Discussed results, dc with family.    After the discussed management above, the patient was determined to be safe for discharge.  The patient was in agreement with this plan and all questions regarding their care were answered.  ED return precautions were discussed and the patient will return to the ED with any significant worsening of condition.' Final Clinical Impression(s) / ED Diagnoses Final diagnoses:  Abrasion of face, initial encounter  Facial hematoma, initial encounter    Rx / DC Orders ED Discharge Orders     None        Milagros Loll, MD 12/13/20 1240

## 2020-12-12 NOTE — ED Triage Notes (Signed)
C/o fall with head injury x 1 hr ago , abrasion to right cheek and hematoma/ puncture wound   above right eyebrow . Abrasions to bil knees

## 2020-12-12 NOTE — Discharge Instructions (Addendum)
Recommend Tylenol or Motrin as needed for pain.  Follow-up with primary care doctor.  Keep wounds clean and dry.  Come back to ER if you develop worsening headache, vomiting or other new concerning symptom.

## 2020-12-16 ENCOUNTER — Ambulatory Visit: Payer: Medicaid Other

## 2020-12-16 ENCOUNTER — Other Ambulatory Visit: Payer: Self-pay

## 2020-12-16 VITALS — BP 142/102

## 2020-12-16 DIAGNOSIS — R2689 Other abnormalities of gait and mobility: Secondary | ICD-10-CM

## 2020-12-16 NOTE — Patient Instructions (Signed)
[facility] Contact Information  [address] [place], [place] [address]  Phone: [phone] Fax: [phone]  HOURS Monday - Friday, 8 a.m. - 5 p.m. 

## 2020-12-16 NOTE — Therapy (Signed)
[facility] [address] [place], [place], [address] Phone: [phone]   Fax:  [phone]  Physical Therapy Treatment Arrived - No Charge  Patient Details  Name: Carrie Day MRN: [id number] Date of Birth: 1976/02/09 Referring Provider (PT): Payton Emerald, MD   Encounter Date: 12/16/2020   PT End of Session - 12/16/20 1027     Visit Number 7   arrived - no charge   Number of Visits 13    Date for PT Re-Evaluation 01/24/21    Authorization Type [employer] (Auth required after 12 visits; VL: 27)    PT Start Time 1017    PT Stop Time 1035    PT Time Calculation (min) 18 min    Equipment Utilized During Treatment Gait belt    Activity Tolerance Patient tolerated treatment well    Behavior During Therapy WFL for tasks assessed/performed             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 Patient presents with swollen R eye and abrasions on R side of face. Patient denies pain. Report that fall was due to her taking to much medication and causing her drowsiness. Denies additional falls.    Patient is accompained by: Family member   Rob, boyfriend   Pertinent History Seizures, Stroke, HTN, Ataxia, Thyroid Disease, TBI HLD, Vit D Deficiency    Limitations Standing;Walking;House hold activities    Patient Stated Goals Walk down stairs without holding onto something/somebody; be able to jump    Currently in Pain? No/denies               Prior to start of session, assessed BP with  elevated reading noted. BP: 138/100, with patient asymptomatic. After questioning, patient stating that she did not take her medications this morning. PT educating on importance of taking medications as prescribed.  Patient seem hesitant to take medications due to recent lightheadedness and associated fall that she seems to correlate to medication. PT educating on following up with PCP regarding appropriate dosage and medication schedule. Upon extended seated rest break, reassessed BP with the following reading: 142/102. Unable to participate in therapy services due to elevated readings. Arrived - No Charge. Patient did state that her PCP put in order for AFO. PT unable to locate within Epic System. Patient reports that [facility]ed [facility] but unsure of location. PT will reach out to PCP office, and encourage patient to also reach out to determine location to allow for appointment to be scheduled to obtain personal AFO. Patient and significant other verbalized agreement.       PT Education - 12/16/20 1027     Education Details Determine [facility] that MD faxed A[facility] order too. Provided [facility] Contact Information; Monitor BP; Take Medications as prescribed    Person(s) Educated Patient    Methods Explanation;Handout    Comprehension Verbalized understanding  PT Short Term Goals - 12/10/20 1335       PT SHORT TERM GOAL #1   Title Patient will be recieve AFO and report understanding on wear schedule    Baseline use of clinical AFO    Time 3    Period Weeks    Status New    Target Date 01/03/21      PT SHORT TERM GOAL #2   Title Patient will be able to ambulate >/= 300 ft outdoors supervision level with AFO and no AD    Baseline TBA    Time 3    Period Weeks    Status New               PT Long Term Goals - 12/10/20 1337       PT LONG TERM GOAL #1   Title Patient will be independent with final HEP for strength/balance (ALL LTGs Due: 01/24/21)     Baseline no HEP established; reports independence will continue to benefit from progressive HEP    Time 6    Period Weeks    Status On-going    Target Date 01/24/21      PT LONG TERM GOAL #2   Title Patient will improve gait speed w/ LRAD to >/= 2.5 ft/sec    Baseline 1.5 ft/sec; 2.17 ft/sec    Time 6    Period Weeks    Status Revised      PT LONG TERM GOAL #3   Title Patient will improve Berh Balance to >/= 45/56 to demo reduced fall risk    Baseline 38/56; 38/56    Time 6    Period Weeks    Status On-going      PT LONG TERM GOAL #4   Title Patient will improve TUG to </= 12 secs w/ LRAD to demo reduced fall risk    Baseline 16.65 secs; 16.2 seconds    Time 6    Period Weeks    Status On-going      PT LONG TERM GOAL #5   Title Patient will improve 5x sit <> stand to </= 15 with BUE support    Baseline 27.09; 19.5 seconds    Time 6    Period Weeks    Status Revised      PT LONG TERM GOAL #6   Title Patient will be able to ascend/descend stairs with step to pattern and no rails supervision to allow for improved entry/exit into home    Baseline Bilat rails or holding onto family member; single rail on R with CGA    Time 6    Period Weeks    Status On-going                    Patient will benefit from skilled therapeutic intervention in order to improve the following deficits and impairments:     Visit Diagnosis: Other abnormalities of gait and mobility     Problem List There are no problems to display for this patient.   Tempie Donning, PT, DPT 12/16/2020, 10:45 AM  [facility] [address] [place], [place], [address] Phone: [phone]   Fax:  [phone]  Name: Carrie Day MRN: [id number] Date of Birth: 1975/05/11

## 2020-12-26 ENCOUNTER — Other Ambulatory Visit: Payer: Self-pay

## 2020-12-26 ENCOUNTER — Ambulatory Visit: Payer: Medicaid Other | Attending: Family Medicine | Admitting: Physical Therapy

## 2020-12-26 VITALS — BP 140/96

## 2020-12-26 DIAGNOSIS — M6281 Muscle weakness (generalized): Secondary | ICD-10-CM | POA: Diagnosis present

## 2020-12-26 DIAGNOSIS — R2681 Unsteadiness on feet: Secondary | ICD-10-CM | POA: Diagnosis present

## 2020-12-26 DIAGNOSIS — R2689 Other abnormalities of gait and mobility: Secondary | ICD-10-CM | POA: Diagnosis not present

## 2020-12-26 DIAGNOSIS — R262 Difficulty in walking, not elsewhere classified: Secondary | ICD-10-CM | POA: Insufficient documentation

## 2020-12-27 NOTE — Therapy (Signed)
[facility] [address] [facility] [address] [place], [place], [address] Phone: [phone]   Fax:  [phone]  Physical Therapy Treatment  Patient Details  Name: Carrie Day MRN: [id number] Date of Birth: Sep 30, 1975 Referring Provider (PT): Payton Emerald, MD   Encounter Date: 12/26/2020   PT End of Session - 12/26/20 1030     Visit Number 8    Number of Visits 13    Date for PT Re-Evaluation 01/24/21    Authorization Type [employer] (Auth required after 12 visits; VL: 27)    PT Start Time 1019    PT Stop Time 1100    PT Time Calculation (min) 41 min    Equipment Utilized During Treatment Gait belt    Activity Tolerance Patient tolerated treatment well    Behavior During Therapy WFL for tasks assessed/performed             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 No falls or pain to report. Has prior authorization letter for AFO from insurance with her. Copy made and will fax to [facility].    Patient is accompained by: Family member   Rob, boyfriend   Pertinent History Seizures, Stroke, HTN, Ataxia, Thyroid Disease, TBI HLD, Vit D Deficiency    Limitations Standing;Walking;House hold activities    Patient Stated Goals Walk down stairs without holding onto something/somebody; be able to jump    Currently in Pain? No/denies    Pain Score 0-No pain                    [facility] Adult PT Treatment/Exercise - 12/26/20 1033       Transfers   Transfers Sit to Stand;Stand to Sit    Sit to Stand 5: Supervision;With upper extremity  assist;From chair/3-in-1    Stand to Sit 5: Supervision;To chair/3-in-1      Ambulation/Gait   Ambulation/Gait Yes    Ambulation/Gait Assistance 5: Supervision;4: Min guard    Ambulation/Gait Assistance Details continued with use of posterior Thusane brace with gait. Facilitation at pelvis to assist with weight shifting with emphasis on right stance    Ambulation Distance (Feet) 115 Feet   x2   Assistive device None;Other (Comment)   right posterior Thuasane brace with heel wedge   Gait Pattern Step-through pattern;Decreased stance time - right;Decreased step length - left;Decreased hip/knee flexion - right;Decreased weight shift to right    Ambulation Surface Level;Indoor      Self-Care   Self-Care Other Self-Care Comments    Other Self-Care Comments  Pt asking about where to get a BP cuff/machine to check BP at home. Discussed potentially getting an order from the MD and going through insurance for DME. Pt stated she was okay with out of pocket, looked a few options on [employer] with instructions on what to look for when purchasing one for home.     Neuro Re-ed    Neuro Re-ed Details  for stengthening/balance/muscle re-ed: seated at edge of mat  in staggered stance with right LE back- sit<>stands x 10 reps with min guard assist, cues on weiight shifting and controlled movements.                    PT Short Term Goals - 12/10/20 1335       PT SHORT TERM GOAL #1   Title Patient will be recieve AFO and report understanding on wear schedule    Baseline use of clinical AFO    Time 3    Period Weeks    Status New    Target Date 01/03/21      PT SHORT TERM GOAL #2   Title Patient will be able to ambulate >/= 300 ft outdoors supervision level with AFO and no AD    Baseline TBA    Time 3    Period Weeks    Status New               PT Long Term Goals - 12/10/20 1337       PT LONG TERM GOAL #1   Title Patient will be independent with final HEP for strength/balance (ALL  LTGs Due: 01/24/21)    Baseline no HEP established; reports independence will continue to benefit from progressive HEP    Time 6    Period Weeks    Status On-going    Target Date 01/24/21      PT LONG TERM GOAL #2   Title Patient will improve gait speed w/ LRAD to >/= 2.5 ft/sec    Baseline 1.5 ft/sec; 2.17 ft/sec    Time 6    Period Weeks    Status Revised      PT LONG TERM GOAL #3   Title Patient will improve Berh Balance to >/= 45/56 to demo reduced fall risk    Baseline 38/56; 38/56    Time 6    Period Weeks    Status On-going      PT LONG TERM GOAL #4   Title Patient will improve TUG to </= 12 secs w/ LRAD to demo reduced fall risk    Baseline 16.65 secs; 16.2 seconds    Time 6    Period Weeks    Status On-going      PT LONG TERM GOAL #5   Title Patient will improve 5x sit <> stand to </= 15 with BUE support    Baseline 27.09; 19.5 seconds    Time 6    Period Weeks    Status Revised      PT LONG TERM GOAL #6   Title Patient will be able to ascend/descend stairs with step to pattern and no rails supervision to allow for improved entry/exit into home    Baseline Bilat rails or holding onto family member; single rail on R with CGA    Time 6    Period Weeks    Status On-going                   Plan - 12/26/20 1030     Clinical Impression Statement Today's skilled session continued to focus on gait and NMR with emphasis on increased right LE weight bearing. Continued with use of clinic AFO as well until pt can get her own. BP on elevated side. Increased after 1st gait rep. Decreased and remained within acceptable values (still elevated on diastolic) with remainder of session. Pt asking about where to get a cuff to check BP at home. Discussed potentially getting an order  from the MD and going through insurance for DME. Pt stated she was okay with out of pocket, looked a few options on [employer] with instructions on what to look for when purchasing one for home. The  pt is progressing toward goals and should benefit from continued PT to progress toward unmet goals.    Personal Factors and Comorbidities Comorbidity 3+;Time since onset of injury/illness/exacerbation    Comorbidities Seizures, Stroke, HTN, Ataxia, Thyroid Disease, TBI HLD, Vit D Deficiency    Examination-Activity Limitations Stairs;Stand;Locomotion Level;Transfers    Examination-Participation Restrictions Community Activity    Stability/Clinical Decision Making Stable/Uncomplicated    Rehab Potential Fair    PT Frequency 1x / week    PT Duration 6 weeks    PT Treatment/Interventions ADLs/Self Care Home Management;Aquatic Therapy;Cryotherapy;Electrical Stimulation;Moist Heat;DME Instruction;Gait training;Stair training;Functional mobility training;Contrast Bath;Neuromuscular re-education;Therapeutic activities;Balance training;Patient/family education;Therapeutic exercise;Orthotic Fit/Training;Dry needling;Manual techniques;Passive range of motion;Vestibular;Joint Manipulations    PT Next Visit Plan CHECK BP. Continue gait training with R posterior Thuasne brace with small heel wedge (3/8" was used last visit), balance training in brace, working on weight shifting over RLE with AFO.    Consulted and Agree with Plan of Care Patient             Patient will benefit from skilled therapeutic intervention in order to improve the following deficits and impairments:  Abnormal gait, Decreased balance, Decreased activity tolerance, Decreased coordination, Decreased safety awareness, Decreased strength, Decreased knowledge of use of DME, Impaired tone, Decreased range of motion, Difficulty walking, Impaired vision/preception  Visit Diagnosis: Other abnormalities of gait and mobility  Muscle weakness (generalized)  Unsteadiness on feet  Difficulty in walking, not elsewhere classified     Problem List There are no problems to display for this patient.   Sallyanne Kuster, PTA, [facility]  [facility] [address] [place], [place] [address] [phone] 12/27/20, 5:03 PM   Name: KIMBERLE STANFILL MRN: [id number] Date of Birth: 11/27/1975

## 2020-12-31 ENCOUNTER — Encounter: Payer: Self-pay | Admitting: Rehabilitation

## 2020-12-31 ENCOUNTER — Other Ambulatory Visit: Payer: Self-pay

## 2020-12-31 ENCOUNTER — Ambulatory Visit: Payer: Medicaid Other | Admitting: Rehabilitation

## 2020-12-31 VITALS — BP 133/94

## 2020-12-31 DIAGNOSIS — R2689 Other abnormalities of gait and mobility: Secondary | ICD-10-CM | POA: Diagnosis not present

## 2020-12-31 DIAGNOSIS — R262 Difficulty in walking, not elsewhere classified: Secondary | ICD-10-CM

## 2020-12-31 DIAGNOSIS — R2681 Unsteadiness on feet: Secondary | ICD-10-CM

## 2020-12-31 DIAGNOSIS — M6281 Muscle weakness (generalized): Secondary | ICD-10-CM

## 2020-12-31 NOTE — Therapy (Signed)
[facility] [address] [place], [place], [address] Phone: [phone]   Fax:  [phone]  Physical Therapy Treatment  Patient Details  Name: Carrie Day MRN: [id number] Date of Birth: 1975/09/25 Referring Provider (PT): Vanita Panda, MD   Encounter Date: 12/31/2020   PT End of Session - 12/31/20 1408     Visit Number 9    Number of Visits 13    Date for PT Re-Evaluation 01/24/21    Authorization Type [employer] (Auth required after 12 visits; VL: 73)    PT Start Time 6060   pt late to session   PT Stop Time 1100    PT Time Calculation (min) 37 min    Equipment Utilized During Treatment Gait belt    Activity Tolerance Patient tolerated treatment well    Behavior During Therapy WFL for tasks assessed/performed             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  Has not heard from [facility] yet.  PT to follow up with primary PT.    Patient is accompained by: Family member    Pertinent History Seizures, Stroke, HTN, Ataxia, Thyroid Disease, TBI HLD, Vit D Deficiency    Limitations Standing;Walking;House hold activities    Patient Stated Goals Walk down stairs without holding onto something/somebody; be able to jump    Currently in Pain? No/denies                               [facility] Adult PT Treatment/Exercise - 12/31/20 1053       Ambulation/Gait   Ambulation/Gait Yes    Ambulation/Gait Assistance 5: Supervision;4: Min guard    Ambulation/Gait Assistance Details Min/guard over outdoor surfaces with AFO and no AD esp when moving downhill and over  uneven pavers.  Cues for increased L step length and increased R stance time (slight assist at pelvis to facilitate this). She is able to ambulate x 300' however would still recommend min/guard with more uneven surfaces for safety at this time, esp when going down/uphill.  Pt with very slow gait speed and often times needs to stop and "reset" before continuing.    Ambulation Distance (Feet) 300 Feet    Assistive device None;Other (Comment)   R AFO w/ heel wedge (thuasne PLS)   Gait Pattern Step-through pattern;Decreased stance time - right;Decreased step length - left;Decreased hip/knee flexion - right;Decreased weight shift to right    Ambulation Surface Level;Unlevel;Indoor;Outdoor;Paved    Gait Comments Note that PT provides several rest breaks as her BP still remains in the 04'H (diastolic).  She reports she did take her medication.  Recommend that she at least speak with MD regarding BP issues.  Pt and BF verbalize understanding.      Self-Care   Self-Care Other Self-Care Comments    Other Self-Care Comments  Educated on bracing and that PT would follow up with primary PT      Neuro Re-ed  Neuro Re-ed Details  RLE NMR for improving R lateral weight shift and RLE weight bearing:  Side stepping over small orange barriers with min/mod A with facilitation for R lateral weight shift and cues for improved R proximal activation when in R stance.  Performed x 2 laps (4 barriers) laterally then forwards x 2 laps again with cues for improved R stance time but also forward hip flex rather than circumduction.                       PT Short Term Goals - 12/31/20 1029       PT SHORT TERM GOAL #1   Title Patient will be recieve AFO and report understanding on wear schedule    Baseline Progress has been started to receive AFO    Time 3    Period Weeks    Status Partially Met    Target Date 01/03/21      PT SHORT TERM GOAL #2   Title Patient will be able to ambulate >/= 300 ft outdoors  supervision level with AFO and no AD    Baseline 300' with AFO and no AD at min/guard level    Time 3    Period Weeks    Status Partially Met               PT Long Term Goals - 12/10/20 1337       PT LONG TERM GOAL #1   Title Patient will be independent with final HEP for strength/balance (ALL LTGs Due: 01/24/21)    Baseline no HEP established; reports independence will continue to benefit from progressive HEP    Time 6    Period Weeks    Status On-going    Target Date 01/24/21      PT LONG TERM GOAL #2   Title Patient will improve gait speed w/ LRAD to >/= 2.5 ft/sec    Baseline 1.5 ft/sec; 2.17 ft/sec    Time 6    Period Weeks    Status Revised      PT LONG TERM GOAL #3   Title Patient will improve Berh Balance to >/= 45/56 to demo reduced fall risk    Baseline 38/56; 38/56    Time 6    Period Weeks    Status On-going      PT LONG TERM GOAL #4   Title Patient will improve TUG to </= 12 secs w/ LRAD to demo reduced fall risk    Baseline 16.65 secs; 16.2 seconds    Time 6    Period Weeks    Status On-going      PT LONG TERM GOAL #5   Title Patient will improve 5x sit <> stand to </= 15 with BUE support    Baseline 27.09; 19.5 seconds    Time 6    Period Weeks    Status Revised      PT LONG TERM GOAL #6   Title Patient will be able to ascend/descend stairs with step to pattern and no rails supervision to allow for improved entry/exit into home    Baseline Bilat rails or holding onto family member; single rail on R with CGA    Time 6    Period Weeks    Status On-going                   Plan - 12/31/20 1409     Clinical Impression Statement Skilled session focused on assessment of  STGs.  Primary PT has been out but is working on getting order for AFO.  Will follow up on this as able.  She is progressing with gait without AD with AFO (clinics) however over unlevel outdoor surfaces, would recommend min/guard for safety at this time.    Personal  Factors and Comorbidities Comorbidity 3+;Time since onset of injury/illness/exacerbation    Comorbidities Seizures, Stroke, HTN, Ataxia, Thyroid Disease, TBI HLD, Vit D Deficiency    Examination-Activity Limitations Stairs;Stand;Locomotion Level;Transfers    Examination-Participation Restrictions Community Activity    Stability/Clinical Decision Making Stable/Uncomplicated    Rehab Potential Fair    PT Frequency 1x / week    PT Duration 6 weeks    PT Treatment/Interventions ADLs/Self Care Home Management;Aquatic Therapy;Cryotherapy;Electrical Stimulation;Moist Heat;DME Instruction;Gait training;Stair training;Functional mobility training;Contrast Bath;Neuromuscular re-education;Therapeutic activities;Balance training;Patient/family education;Therapeutic exercise;Orthotic Fit/Training;Dry needling;Manual techniques;Passive range of motion;Vestibular;Joint Manipulations    PT Next Visit Plan CHECK BP. Continue gait training with R posterior Thuasne brace with small heel wedge (3/8" was used last visit), balance training in brace, working on weight shifting over RLE with AFO.    Consulted and Agree with Plan of Care Patient             Patient will benefit from skilled therapeutic intervention in order to improve the following deficits and impairments:  Abnormal gait, Decreased balance, Decreased activity tolerance, Decreased coordination, Decreased safety awareness, Decreased strength, Decreased knowledge of use of DME, Impaired tone, Decreased range of motion, Difficulty walking, Impaired vision/preception  Visit Diagnosis: Other abnormalities of gait and mobility  Muscle weakness (generalized)  Unsteadiness on feet  Difficulty in walking, not elsewhere classified     Problem List There are no problems to display for this patient.   Cameron Sprang, PT, MPT [facility] [address] [place], [place], [address] Phone: [phone]   Fax:   [phone] 12/31/20, 5:13 PM   Name: Carrie Day MRN: [id number] Date of Birth: 05/30/1975

## 2021-01-07 ENCOUNTER — Ambulatory Visit: Payer: Medicaid Other

## 2021-01-07 ENCOUNTER — Other Ambulatory Visit: Payer: Self-pay

## 2021-01-07 VITALS — BP 120/84

## 2021-01-07 DIAGNOSIS — R2689 Other abnormalities of gait and mobility: Secondary | ICD-10-CM

## 2021-01-07 DIAGNOSIS — M6281 Muscle weakness (generalized): Secondary | ICD-10-CM

## 2021-01-07 DIAGNOSIS — R2681 Unsteadiness on feet: Secondary | ICD-10-CM

## 2021-01-07 NOTE — Therapy (Signed)
[facility] [address] [place], [place], [address] Phone: [phone]   Fax:  [phone]  Physical Therapy Treatment  Patient Details  Name: Carrie Day MRN: [id number] Date of Birth: 1975/10/24 Referring Provider (PT): Vanita Panda, MD   Encounter Date: 01/07/2021   PT End of Session - 01/07/21 1015     Visit Number 10    Number of Visits 13    Date for PT Re-Evaluation 01/24/21    Authorization Type [employer] (Auth required after 12 visits; VL: 27)    PT Start Time 1013    PT Stop Time 1058    PT Time Calculation (min) 45 min    Equipment Utilized During Treatment Gait belt    Activity Tolerance Patient tolerated treatment well    Behavior During Therapy WFL for tasks assessed/performed             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 PT called over to [employer]) to check on status and they are still waiting on doctor to fill out the paperwork.    Patient is accompained by: Family member    Pertinent History Seizures, Stroke, HTN, Ataxia, Thyroid Disease, TBI HLD, Vit D Deficiency    Limitations Standing;Walking;House hold activities    Patient Stated Goals Walk down stairs without holding onto something/somebody; be able to jump    Currently in Pain? No/denies                               [facility] Adult PT Treatment/Exercise - 01/07/21 1024       Transfers   Transfers Sit to Stand;Stand to Sit    Sit to Stand 5: Supervision    Stand to Sit 5: Supervision      Ambulation/Gait   Ambulation/Gait Yes    Ambulation/Gait  Assistance 5: Supervision    Ambulation/Gait Assistance Details Pt was cued to shift weight over right leg and increase left step length.    Ambulation Distance (Feet) 230 Feet    Assistive device None   right posterior Thuasne AFO with 3/8" heel lift.   Gait Pattern Step-through pattern;Decreased stance time - right;Decreased arm swing - right;Decreased weight shift to right;Decreased hip/knee flexion - right    Ambulation Surface Level;Indoor      Neuro Re-ed    Neuro Re-ed Details  At bottom of steps: step-ups with RLE x 10 with finger tip support on left with cues to weight shift and control movements. Dynamic gait activities: stepping over 4 low hurdles, weaving in and out of 5 cones x 4 each with min assist over hurdles. Alternating taps on cone x 10 each leg with min assist during right SLS. Sit to stand x 10 with airex under feet with verbal cues to engage core to help with stability and control descent.                       PT  Short Term Goals - 12/31/20 1029       PT SHORT TERM GOAL #1   Title Patient will be recieve AFO and report understanding on wear schedule    Baseline Progress has been started to receive AFO    Time 3    Period Weeks    Status Partially Met    Target Date 01/03/21      PT SHORT TERM GOAL #2   Title Patient will be able to ambulate >/= 300 ft outdoors supervision level with AFO and no AD    Baseline 300' with AFO and no AD at min/guard level    Time 3    Period Weeks    Status Partially Met               PT Long Term Goals - 12/10/20 1337       PT LONG TERM GOAL #1   Title Patient will be independent with final HEP for strength/balance (ALL LTGs Due: 01/24/21)    Baseline no HEP established; reports independence will continue to benefit from progressive HEP    Time 6    Period Weeks    Status On-going    Target Date 01/24/21      PT LONG TERM GOAL #2   Title Patient will improve gait speed w/ LRAD to >/= 2.5 ft/sec     Baseline 1.5 ft/sec; 2.17 ft/sec    Time 6    Period Weeks    Status Revised      PT LONG TERM GOAL #3   Title Patient will improve Berh Balance to >/= 45/56 to demo reduced fall risk    Baseline 38/56; 38/56    Time 6    Period Weeks    Status On-going      PT LONG TERM GOAL #4   Title Patient will improve TUG to </= 12 secs w/ LRAD to demo reduced fall risk    Baseline 16.65 secs; 16.2 seconds    Time 6    Period Weeks    Status On-going      PT LONG TERM GOAL #5   Title Patient will improve 5x sit <> stand to </= 15 with BUE support    Baseline 27.09; 19.5 seconds    Time 6    Period Weeks    Status Revised      PT LONG TERM GOAL #6   Title Patient will be able to ascend/descend stairs with step to pattern and no rails supervision to allow for improved entry/exit into home    Baseline Bilat rails or holding onto family member; single rail on R with CGA    Time 6    Period Weeks    Status On-going                   Plan - 01/07/21 1431     Clinical Impression Statement Pt still waiting on AFO. This PT called [employer] office and they are just waiting on Dr. Jimmye Norman to fax back the paperwork they requested from end of September. They will reach back out and PT advised pt to reach out to MD as well. PT continued to work on gait with right posterior Thuasne AFO with heel lift with activities trying to increase right stance time. Pt needs min assist at times with right SLS activities.    Personal Factors and Comorbidities Comorbidity 3+;Time since onset of injury/illness/exacerbation    Comorbidities Seizures, Stroke, HTN, Ataxia, Thyroid Disease, TBI HLD,  Vit D Deficiency    Examination-Activity Limitations Stairs;Stand;Locomotion Level;Transfers    Examination-Participation Restrictions Community Activity    Stability/Clinical Decision Making Stable/Uncomplicated    Rehab Potential Fair    PT Frequency 1x / week    PT Duration 6 weeks    PT  Treatment/Interventions ADLs/Self Care Home Management;Aquatic Therapy;Cryotherapy;Electrical Stimulation;Moist Heat;DME Instruction;Gait training;Stair training;Functional mobility training;Contrast Bath;Neuromuscular re-education;Therapeutic activities;Balance training;Patient/family education;Therapeutic exercise;Orthotic Fit/Training;Dry needling;Manual techniques;Passive range of motion;Vestibular;Joint Manipulations    PT Next Visit Plan CHECK BP. Any further update on AFO from [place]? Continue gait training with R posterior Thuasne brace with small heel wedge (3/8" was used last visit), balance training in brace, working on weight shifting over RLE with AFO.    Consulted and Agree with Plan of Care Patient             Patient will benefit from skilled therapeutic intervention in order to improve the following deficits and impairments:  Abnormal gait, Decreased balance, Decreased activity tolerance, Decreased coordination, Decreased safety awareness, Decreased strength, Decreased knowledge of use of DME, Impaired tone, Decreased range of motion, Difficulty walking, Impaired vision/preception  Visit Diagnosis: Other abnormalities of gait and mobility  Muscle weakness (generalized)  Unsteadiness on feet     Problem List There are no problems to display for this patient.   Electa Sniff, PT, DPT, NCS 01/07/2021, 2:35 PM  [facility] [address] [place], [place], [address] Phone: [phone]   Fax:  [phone]  Name: Carrie Day MRN: [id number] Date of Birth: 01-01-1976

## 2021-01-14 ENCOUNTER — Other Ambulatory Visit: Payer: Self-pay

## 2021-01-14 ENCOUNTER — Ambulatory Visit: Payer: Medicaid Other | Admitting: Physical Therapy

## 2021-01-14 ENCOUNTER — Encounter: Payer: Self-pay | Admitting: Physical Therapy

## 2021-01-14 VITALS — BP 138/89 | HR 56

## 2021-01-14 DIAGNOSIS — M6281 Muscle weakness (generalized): Secondary | ICD-10-CM

## 2021-01-14 DIAGNOSIS — R262 Difficulty in walking, not elsewhere classified: Secondary | ICD-10-CM

## 2021-01-14 DIAGNOSIS — R2689 Other abnormalities of gait and mobility: Secondary | ICD-10-CM | POA: Diagnosis not present

## 2021-01-14 DIAGNOSIS — R2681 Unsteadiness on feet: Secondary | ICD-10-CM

## 2021-01-15 NOTE — Therapy (Signed)
[facility] [address] [place], [place], [address] Phone: [phone]   Fax:  [phone]  Physical Therapy Treatment  Patient Details  Name: Carrie Day MRN: [id number] Date of Birth: 1975-12-02 Referring Provider (PT): Vanita Panda, MD   Encounter Date: 01/14/2021    01/14/21 1021  PT Visits / Re-Eval  Visit Number 11  Number of Visits 13  Date for PT Re-Evaluation 01/24/21  Authorization  Authorization Type [employer] (Auth required after 12 visits; VL: 27)  PT Time Calculation  PT Start Time 1019  PT Stop Time 1100  PT Time Calculation (min) 41 min  PT - End of Session  Equipment Utilized During Treatment Gait belt  Activity Tolerance Patient tolerated treatment well  Behavior During Therapy WFL for tasks assessed/performed    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 No issues with donning/doffing per pt report. Recieved it yesterday.  Pertinent History Seizures, Stroke, HTN, Ataxia, Thyroid Disease, TBI HLD, Vit D Deficiency  Limitations Standing;Walking;House hold activities  Patient Stated Goals Walk down stairs without holding onto something/somebody; be able to jump  Pain Assessment  Currently in Pain? No/denies     01/14/21 1022  Transfers  Transfers Sit to Stand;Stand to Sit  Sit to Stand 5: Supervision  Stand to Sit 5: Supervision  Ambulation/Gait  Ambulation/Gait Yes  Ambulation/Gait Assistance 5: Supervision  Ambulation/Gait Assistance Details use of pt's AFO with heel wedge for gait. facilitation at pelvis for increased right weight  shifting in stance. no balance issues noted with gait outdoors  Ambulation Distance (Feet) 500 Feet (x1, plus around clinic with session)  Assistive device None;Other (Comment)  Gait Pattern Step-through pattern;Decreased stance time - right;Decreased arm swing - right;Decreased weight shift to right;Decreased hip/knee flexion - right  Ambulation Surface Level;Unlevel;Indoor;Outdoor;Paved  Self-Care  Self-Care Other Self-Care Comments  Other Self-Care Comments  removed brace to check skin with no issues noted.  Neuro Re-ed   Neuro Re-ed Details  for strengthening/muscle re-ed: standing at bottom of steps with light fingertip support on left UE- right stance for left foot taps up<>down bottom 2 steps, then left stance for right foot taps up<>down bottom 2 steps, cues for lifting the foot/not sliding it along, min guard asisst for balance; right foot on bottom step- lifting left LE up into a march/back down to floor for 10 reps.cues for increased LE use and decreased UE assist.      01/14/21 1049  Balance Exercises: Standing  Standing Eyes Closed Narrow base of support (BOS);Foam/compliant surface;3 reps;30 secs;Limitations  Standing Eyes Closed Limitations on airex with feet together, no UE support with occasional touch to chair for balance assistance for EC 30 sec 's x 3 reps. min guard to min assist for balance with cues on posture/weight shifting  Partial Tandem Stance Eyes open;Foam/compliant surface;Intermittent upper extremity support;2 reps;30 secs;Limitations  Partial Tandem Stance Limitations on airex with no UE support for 2 reps each foot forward. occasional touch to chair back for balance assistance. cues on weight shifting to assist  with balance.           PT Short Term Goals - 12/31/20 1029       PT SHORT TERM GOAL #1   Title Patient will be recieve AFO and report understanding on wear schedule    Baseline Progress has been started to receive AFO    Time 3    Period  Weeks    Status Partially Met    Target Date 01/03/21      PT SHORT TERM GOAL #2   Title Patient will be able to ambulate >/= 300 ft outdoors supervision level with AFO and no AD    Baseline 300' with AFO and no AD at min/guard level    Time 3    Period Weeks    Status Partially Met               PT Long Term Goals - 12/10/20 1337       PT LONG TERM GOAL #1   Title Patient will be independent with final HEP for strength/balance (ALL LTGs Due: 01/24/21)    Baseline no HEP established; reports independence will continue to benefit from progressive HEP    Time 6    Period Weeks    Status On-going    Target Date 01/24/21      PT LONG TERM GOAL #2   Title Patient will improve gait speed w/ LRAD to >/= 2.5 ft/sec    Baseline 1.5 ft/sec; 2.17 ft/sec    Time 6    Period Weeks    Status Revised      PT LONG TERM GOAL #3   Title Patient will improve Berh Balance to >/= 45/56 to demo reduced fall risk    Baseline 38/56; 38/56    Time 6    Period Weeks    Status On-going      PT LONG TERM GOAL #4   Title Patient will improve TUG to </= 12 secs w/ LRAD to demo reduced fall risk    Baseline 16.65 secs; 16.2 seconds    Time 6    Period Weeks    Status On-going      PT LONG TERM GOAL #5   Title Patient will improve 5x sit <> stand to </= 15 with BUE support    Baseline 27.09; 19.5 seconds    Time 6    Period Weeks    Status Revised      PT LONG TERM GOAL #6   Title Patient will be able to ascend/descend stairs with step to pattern and no rails supervision to allow for improved entry/exit into home    Baseline Bilat rails or holding onto family member; single rail on R with CGA    Time 6    Period Weeks    Status On-going               01/14/21 1021  Plan  Clinical Impression Statement Today''s skilled session intiailly addressed care with new AFO and gait with pt's new AFO. Remainder of session focused on balance training with no issues noted or reported. The  pt is making progress toward goals and should benefit from continued PT to progress toward unmet goals.  Personal Factors and Comorbidities Comorbidity 3+;Time since onset of injury/illness/exacerbation  Comorbidities Seizures, Stroke, HTN, Ataxia, Thyroid Disease, TBI HLD, Vit D Deficiency  Examination-Activity Limitations Stairs;Stand;Locomotion Level;Transfers  Examination-Participation Restrictions Community Activity  Pt will benefit from skilled therapeutic intervention in order to improve on the following  deficits Abnormal gait;Decreased balance;Decreased activity tolerance;Decreased coordination;Decreased safety awareness;Decreased strength;Decreased knowledge of use of DME;Impaired tone;Decreased range of motion;Difficulty walking;Impaired vision/preception  Stability/Clinical Decision Making Stable/Uncomplicated  Rehab Potential Fair  PT Frequency 1x / week  PT Duration 6 weeks  PT Treatment/Interventions ADLs/Self Care Home Management;Aquatic Therapy;Cryotherapy;Electrical Stimulation;Moist Heat;DME Instruction;Gait training;Stair training;Functional mobility training;Contrast Bath;Neuromuscular re-education;Therapeutic activities;Balance training;Patient/family education;Therapeutic exercise;Orthotic Fit/Training;Dry needling;Manual techniques;Passive range of motion;Vestibular;Joint Manipulations  PT Next Visit Plan CHECK BP. Continue gait training with pt's AFO/heel wedge;  balance training in brace, working on weight shifting over RLE with AFO.  Consulted and Agree with Plan of Care Patient         Patient will benefit from skilled therapeutic intervention in order to improve the following deficits and impairments:  Abnormal gait, Decreased balance, Decreased activity tolerance, Decreased coordination, Decreased safety awareness, Decreased strength, Decreased knowledge of use of DME, Impaired tone, Decreased range of motion, Difficulty walking, Impaired vision/preception  Visit  Diagnosis: Other abnormalities of gait and mobility  Muscle weakness (generalized)  Unsteadiness on feet  Difficulty in walking, not elsewhere classified     Problem List There are no problems to display for this patient.   Willow Ora, PTA, [facility] [address] [place], [place] [address] [phone] 01/15/21, 9:12 PM   Name: Carrie Day MRN: [id number] Date of Birth: [birth year]

## 2021-01-21 ENCOUNTER — Other Ambulatory Visit: Payer: Self-pay

## 2021-01-21 ENCOUNTER — Ambulatory Visit: Payer: Medicaid Other | Attending: Family Medicine

## 2021-01-21 VITALS — BP 149/92 | HR 54

## 2021-01-21 DIAGNOSIS — R2681 Unsteadiness on feet: Secondary | ICD-10-CM | POA: Diagnosis present

## 2021-01-21 DIAGNOSIS — R2689 Other abnormalities of gait and mobility: Secondary | ICD-10-CM | POA: Insufficient documentation

## 2021-01-21 DIAGNOSIS — R262 Difficulty in walking, not elsewhere classified: Secondary | ICD-10-CM | POA: Diagnosis present

## 2021-01-21 DIAGNOSIS — M6281 Muscle weakness (generalized): Secondary | ICD-10-CM | POA: Diagnosis present

## 2021-01-21 NOTE — Therapy (Signed)
[facility] [address] [place], [place], [address] Phone: [phone]   Fax:  [phone]  Physical Therapy Treatment/Discharge Summary  Patient Details  Name: Carrie Day MRN: [id number] Date of Birth: 1976/01/13 Referring Provider (PT): Vanita Panda, MD  PHYSICAL THERAPY DISCHARGE SUMMARY  Visits from Start of Care: 12  Current functional level related to goals / functional outcomes: See Clinical Impression Statement   Remaining deficits: Mild Imbalance   Education / Equipment: HEP Provided   Patient agrees to discharge. Patient goals were partially met. Patient is being discharged due to meeting the stated rehab goals.   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 No falls to report. No pain.    Pertinent History Seizures, Stroke, HTN, Ataxia, Thyroid Disease, TBI HLD, Vit D Deficiency    Limitations Standing;Walking;House hold activities    Patient Stated Goals Walk down stairs without holding onto something/somebody; be able to jump     Currently in Pain? No/denies                [facility] PT Assessment - 01/21/21 0001       Assessment   Medical Diagnosis TBI/CVA    Referring Provider (PT) Vanita Panda, MD      Transfers   Transfers Sit to Stand;Stand to Sit    Sit to Stand 6: Modified independent (Device/Increase time)    Five time sit to stand comments  15.54 secs with no UE support support from standard chair    Stand to Sit 6: Modified independent (Device/Increase time)      Ambulation/Gait   Ambulation/Gait Yes    Ambulation/Gait Assistance 6: Modified independent (Device/Increase time)    Ambulation/Gait Assistance Details continued use of AFO with ambulation throughout therapy session.    Ambulation Distance (Feet) --   clinic distance   Assistive device None;Other (Comment)   AFO   Gait Pattern Step-through pattern;Decreased stance time - right;Decreased arm swing - right;Decreased weight shift to right;Decreased hip/knee flexion - right    Ambulation Surface Level;Indoor    Gait velocity 2.55 ft/sec no AD, use of AFO    Stairs Yes    Stairs Assistance 5: Supervision;4: Min guard    Stairs Assistance Details (indicate cue type and reason) completed step to pattern initially with single rail with increased catching of  R Toe, progressed to alternating with single rail with improved safety and reciprocal pattern noted. CGA/supervision level. PT educating on appropraite safety with stair negotiation in home/community    Stair Management Technique One rail Right;Alternating pattern;Forwards    Number of Stairs 12    Height of Stairs 6      Standardized Balance Assessment   Standardized Balance Assessment Berg Balance Test;Timed Up and Go Test      Berg Balance Test   Sit to Stand Able to stand without using hands and stabilize independently    Standing Unsupported Able to stand safely 2 minutes    Sitting with Back Unsupported but Feet Supported on Floor or Stool Able to sit safely and securely 2  minutes    Stand to Sit Sits safely with minimal use of hands    Transfers Able to transfer safely, minor use of hands    Standing Unsupported with Eyes Closed Able to stand 10 seconds safely    Standing Unsupported with Feet Together Able to place feet together independently and stand for 1 minute with supervision    From Standing, Reach Forward with Outstretched Arm Can reach confidently >25 cm (10")    From Standing Position, Pick up Object from Floor Able to pick up shoe, needs supervision    From Standing Position, Turn to Look Behind Over each Shoulder Looks behind from both sides and weight shifts well    Turn 360 Degrees Able to turn 360 degrees safely one side only in 4 seconds or less    Standing Unsupported, Alternately Place Feet on Step/Stool Able to stand independently and complete 8 steps >20 seconds    Standing Unsupported, One Foot in Front Able to take small step independently and hold 30 seconds    Standing on One Leg Tries to lift leg/unable to hold 3 seconds but remains standing independently    Total Score 47      Timed Up and Go Test   TUG Normal TUG    Normal TUG (seconds) 14.53   no AD, AFO donned             [facility] Adult PT Treatment/Exercise - 01/21/21 0001       Exercises   Exercises Other Exercises    Other Exercises  Completed verbal review of HEP to ensure compliane and independence prior to d/c. No questions/concerns from patient at this time.             Access Code: [email or web address] URL: [email or web address] Date: 11/14/2020 Prepared by: Willow Ora   Exercises Sit to Stand Without Arm Support - 1 x daily - 5 x weekly - 2 sets - 10 reps Standing Forward Step Taps with Counter Support - 1 x daily - 5 x weekly - 1 sets - 10 reps Standing Near Stance in Corner - 1 x daily - 5 x weekly - 1 sets - 10 reps Standing Balance in Corner with Eyes Closed - 1 x daily - 5 x weekly - 1 sets - 3 reps - 30 sec hold Tandem Stance in Corner - 1 x  daily - 5 x weekly - 1 sets - 2-3 reps - 30 sec's hold    PT Education - 01/21/21 1115     Education Details progress toward LTGs; d/c; HEP Review    Person(s) Educated Patient    Methods Explanation    Comprehension Verbalized understanding              PT Short Term Goals -  12/31/20 1029       PT SHORT TERM GOAL #1   Title Patient will be recieve AFO and report understanding on wear schedule    Baseline Progress has been started to receive AFO    Time 3    Period Weeks    Status Partially Met    Target Date 01/03/21      PT SHORT TERM GOAL #2   Title Patient will be able to ambulate >/= 300 ft outdoors supervision level with AFO and no AD    Baseline 300' with AFO and no AD at min/guard level    Time 3    Period Weeks    Status Partially Met               PT Long Term Goals - 01/21/21 1028       PT LONG TERM GOAL #1   Title Patient will be independent with final HEP for strength/balance (ALL LTGs Due: 01/24/21)    Baseline no HEP established; reports independence will continue to benefit from progressive HEP; reports independence with HEP, completing daily    Time 6    Period Weeks    Status Achieved      PT LONG TERM GOAL #2   Title Patient will improve gait speed w/ LRAD to >/= 2.5 ft/sec    Baseline 1.5 ft/sec; 2.17 ft/sec; 01/21/2021 12.82 secs = 2.55 ft/sec    Time 6    Period Weeks    Status Achieved      PT LONG TERM GOAL #3   Title Patient will improve Berh Balance to >/= 45/56 to demo reduced fall risk    Baseline 38/56; 38/56; 47/56    Time 6    Period Weeks    Status Achieved      PT LONG TERM GOAL #4   Title Patient will improve TUG to </= 12 secs w/ LRAD to demo reduced fall risk    Baseline 16.65 secs; 16.2 seconds; 01/21/2021  14.53 sec    Time 6    Period Weeks    Status Not Met      PT LONG TERM GOAL #5   Title Patient will improve 5x sit <> stand to </= 15 with BUE support    Baseline 27.09; 19.5 seconds; 01/21/2021 15.54  sec without UE support    Time 6    Period Weeks    Status Partially Met      PT LONG TERM GOAL #6   Title Patient will be able to ascend/descend stairs with step to pattern and no rails supervision to allow for improved entry/exit into home    Baseline Bilat rails or holding onto family member; single rail on R with CGA; single rail on R with reciprocal step pattern ascending and bil rails and reciprocal step pattern descending, intermittent CGA/supervision    Time 6    Period Weeks    Status Not Met                   Plan - 01/21/21 1116     Clinical Impression Statement Completed assesment of patient's progress toward all LTGs. Patient able to meet LTG #1-3, and 5 today during session. All other goals patient made progress toward but did not meet goal level. Patient demo improvecd gait speed to 2.55 ft/sec with no AD. Patient also demonstrating reduced fall risk with Berg Balance score of 47/56. Patient has made significant progress with PT services and is demonstating readiness  for d/c at this time, with patient verbalize agreement.    Personal Factors and Comorbidities Comorbidity 3+;Time since onset of injury/illness/exacerbation    Comorbidities Seizures, Stroke, HTN, Ataxia, Thyroid Disease, TBI HLD, Vit D Deficiency    Examination-Activity Limitations Stairs;Stand;Locomotion Level;Transfers    Examination-Participation Restrictions Community Activity    Stability/Clinical Decision Making Stable/Uncomplicated    Rehab Potential Fair    PT Frequency 1x / week    PT Duration 6 weeks    PT Treatment/Interventions ADLs/Self Care Home Management;Aquatic Therapy;Cryotherapy;Electrical Stimulation;Moist Heat;DME Instruction;Gait training;Stair training;Functional mobility training;Contrast Bath;Neuromuscular re-education;Therapeutic activities;Balance training;Patient/family education;Therapeutic exercise;Orthotic Fit/Training;Dry needling;Manual techniques;Passive range of  motion;Vestibular;Joint Manipulations    Consulted and Agree with Plan of Care Patient             Patient will benefit from skilled therapeutic intervention in order to improve the following deficits and impairments:  Abnormal gait, Decreased balance, Decreased activity tolerance, Decreased coordination, Decreased safety awareness, Decreased strength, Decreased knowledge of use of DME, Impaired tone, Decreased range of motion, Difficulty walking, Impaired vision/preception  Visit Diagnosis: Other abnormalities of gait and mobility  Muscle weakness (generalized)  Unsteadiness on feet  Difficulty in walking, not elsewhere classified     Problem List There are no problems to display for this patient.   Jones Bales, PT, DPT 01/21/2021, 11:18 AM  [facility] [address] [place], [place], [address] Phone: [phone]   Fax:  [phone]  Name: Carrie Day MRN: [id number] Date of Birth: Nov 21, 1975

## 2021-01-28 ENCOUNTER — Ambulatory Visit: Payer: Medicaid Other

## 2021-02-20 ENCOUNTER — Encounter (HOSPITAL_COMMUNITY): Payer: Self-pay | Admitting: Emergency Medicine

## 2021-02-20 ENCOUNTER — Emergency Department (HOSPITAL_COMMUNITY): Payer: Medicaid Other

## 2021-02-20 ENCOUNTER — Emergency Department (HOSPITAL_COMMUNITY)
Admission: EM | Admit: 2021-02-20 | Discharge: 2021-02-20 | Disposition: A | Discharge status: Home / Self Care | Payer: Medicaid Other | Attending: Emergency Medicine | Admitting: Emergency Medicine

## 2021-02-20 DIAGNOSIS — R42 Dizziness and giddiness: Secondary | ICD-10-CM | POA: Diagnosis present

## 2021-02-20 DIAGNOSIS — I10 Essential (primary) hypertension: Secondary | ICD-10-CM | POA: Diagnosis not present

## 2021-02-20 DIAGNOSIS — R519 Headache, unspecified: Secondary | ICD-10-CM | POA: Diagnosis not present

## 2021-02-20 DIAGNOSIS — Z79899 Other long term (current) drug therapy: Secondary | ICD-10-CM | POA: Diagnosis not present

## 2021-02-20 DIAGNOSIS — F17210 Nicotine dependence, cigarettes, uncomplicated: Secondary | ICD-10-CM | POA: Insufficient documentation

## 2021-02-20 DIAGNOSIS — N390 Urinary tract infection, site not specified: Secondary | ICD-10-CM | POA: Insufficient documentation

## 2021-02-20 DIAGNOSIS — R0789 Other chest pain: Secondary | ICD-10-CM | POA: Insufficient documentation

## 2021-02-20 DIAGNOSIS — E039 Hypothyroidism, unspecified: Secondary | ICD-10-CM | POA: Diagnosis not present

## 2021-02-20 LAB — CBC WITH DIFFERENTIAL/PLATELET
Abs Immature Granulocytes: 0.02 10*3/uL (ref 0.00–0.07)
Basophils Absolute: 0.1 10*3/uL (ref 0.0–0.1)
Basophils Relative: 1 %
Eosinophils Absolute: 0.2 10*3/uL (ref 0.0–0.5)
Eosinophils Relative: 3 %
HCT: 46.9 % — ABNORMAL HIGH (ref 36.0–46.0)
Hemoglobin: 15.6 g/dL — ABNORMAL HIGH (ref 12.0–15.0)
Immature Granulocytes: 0 %
Lymphocytes Relative: 27 %
Lymphs Abs: 2.3 10*3/uL (ref 0.7–4.0)
MCH: 32.4 pg (ref 26.0–34.0)
MCHC: 33.3 g/dL (ref 30.0–36.0)
MCV: 97.3 fL (ref 80.0–100.0)
Monocytes Absolute: 0.6 10*3/uL (ref 0.1–1.0)
Monocytes Relative: 7 %
Neutro Abs: 5.4 10*3/uL (ref 1.7–7.7)
Neutrophils Relative %: 62 %
Platelets: 395 10*3/uL (ref 150–400)
RBC: 4.82 MIL/uL (ref 3.87–5.11)
RDW: 13.9 % (ref 11.5–15.5)
WBC: 8.6 10*3/uL (ref 4.0–10.5)
nRBC: 0 % (ref 0.0–0.2)

## 2021-02-20 LAB — URINALYSIS, MICROSCOPIC (REFLEX)

## 2021-02-20 LAB — URINALYSIS, ROUTINE W REFLEX MICROSCOPIC
Bilirubin Urine: NEGATIVE
Glucose, UA: NEGATIVE mg/dL
Ketones, ur: NEGATIVE mg/dL
Leukocytes,Ua: NEGATIVE
Nitrite: POSITIVE — AB
Protein, ur: NEGATIVE mg/dL
Specific Gravity, Urine: 1.025 (ref 1.005–1.030)
pH: 6.5 (ref 5.0–8.0)

## 2021-02-20 LAB — BASIC METABOLIC PANEL
Anion gap: 8 (ref 5–15)
BUN: 9 mg/dL (ref 6–20)
CO2: 31 mmol/L (ref 22–32)
Calcium: 9.1 mg/dL (ref 8.9–10.3)
Chloride: 103 mmol/L (ref 98–111)
Creatinine, Ser: 0.66 mg/dL (ref 0.44–1.00)
GFR, Estimated: >60 mL/min (ref >60)
Glucose, Bld: 99 mg/dL (ref 70–99)
Potassium: 3.5 mmol/L (ref 3.5–5.1)
Sodium: 142 mmol/L (ref 135–145)

## 2021-02-20 LAB — TSH: TSH: 9.995 u[IU]/mL — ABNORMAL HIGH (ref 0.350–4.500)

## 2021-02-20 MED ORDER — CEPHALEXIN 500 MG PO CAPS: 500.0000 mg | ORAL_CAPSULE | Freq: Four times a day (QID) | ORAL | 0 refills | Status: DC

## 2021-02-20 MED ORDER — LEVOTHYROXINE SODIUM 100 MCG PO TABS: 100.0000 ug | ORAL_TABLET | Freq: Every day | ORAL | 1 refills | Status: AC

## 2021-02-20 MED ORDER — CEPHALEXIN 250 MG PO CAPS
500.0000 mg | ORAL_CAPSULE | Freq: Once | ORAL | Status: AC
  Administered 2021-02-20: 500 mg via ORAL
  Filled 2021-02-20: qty 2

## 2021-02-20 NOTE — ED Provider Notes (Signed)
[facility] EMERGENCY DEPARTMENT Provider Note   CSN: [id number] Arrival date & time: 02/20/21  [id number]     History Chief Complaint  Patient presents with   Dizziness    Carrie Day is a 45 y.o. female.  HPI She presents for evaluation of dizziness and falling.  This is a chronic problem.  Last fall was 2 days ago when she injured her head.  She complains of headache since then.  She uses a walker for ambulation.  She follows regularly with her PCP.  She has undergone physical therapy and rehab recently.  She complains of malodorous urine but denies vaginal discharge.  She denies fever, chills, nausea, vomiting, focal weakness or paresthesia.  She states she is taking her usual medications.    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   Past Surgical History:  Procedure Laterality Date   SPLENECTOMY       OB History   No obstetric history on file.     No family history on file.  Social History   Tobacco Use   Smoking status: Every Day    Types: Cigarettes   Smokeless tobacco: Never   Tobacco comments:    3-4 cigs/day  Vaping Use   Vaping Use: Never used  Substance Use Topics   Alcohol use: Yes    Comment: occ   Drug use: Yes    Types: Marijuana    Home Medications Prior to Admission medications   Medication Sig Start Date End Date Taking? Authorizing Provider  cephALEXin (KEFLEX) 500 MG capsule Take 1 capsule (500 mg total) by mouth 4 (four) times daily. 02/20/21  Yes Daleen Bo, MD  amLODipine (NORVASC) 10 MG tablet Take 1 tablet (10 mg total) by mouth daily. 04/04/20 09/30/20  Breck Coons, MD  atorvastatin (LIPITOR) 10 MG tablet Take 10 mg by mouth daily.    [provider]  cholecalciferol (VITAMIN D3) 25 MCG (1000 UNIT) tablet Take 1,000 Units by  mouth daily.    [provider]  clotrimazole (LOTRIMIN) 1 % cream Apply 1 application topically 2 (two) times daily. 09/30/20   Shelly Bombard, MD  levothyroxine (SYNTHROID) 100 MCG tablet Take 1 tablet (100 mcg total) by mouth daily before breakfast. 02/20/21   Daleen Bo, MD  metroNIDAZOLE (FLAGYL) 500 MG tablet Take 1 tablet (500 mg total) by mouth 2 (two) times daily. 10/01/20   Shelly Bombard, MD  nystatin cream (MYCOSTATIN) Apply 1 application topically 2 (two) times daily.    [provider]  phenytoin (DILANTIN) 100 MG ER capsule Take 1 capsule (100 mg total) by mouth 3 (three) times daily. 04/04/20 09/30/20  Breck Coons, MD    Allergies    Penicillins  Review of Systems   Review of Systems  All other systems reviewed and are negative.  Physical Exam Updated Vital Signs BP (!) 144/86 (BP Location: Right Arm)   Pulse 92   Temp 98.7 F (37.1 C) (Oral)   Resp 15   LMP 09/06/2014   SpO2 98%   Physical Exam Vitals and nursing note reviewed.  Constitutional:      General: She is not in acute distress.    Appearance: She is well-developed. She is  not ill-appearing.  HENT:     Head: Normocephalic and atraumatic.     Right Ear: External ear normal.     Left Ear: External ear normal.  Eyes:     Conjunctiva/sclera: Conjunctivae normal.     Pupils: Pupils are equal, round, and reactive to light.  Neck:     Trachea: Phonation normal.  Cardiovascular:     Rate and Rhythm: Normal rate and regular rhythm.  Pulmonary:     Effort: Pulmonary effort is normal. No respiratory distress.     Breath sounds: Normal breath sounds. No stridor.  Chest:     Chest wall: Tenderness present.  Abdominal:     General: There is no distension.     Palpations: Abdomen is soft.  Musculoskeletal:        General: Normal range of motion.     Cervical back: Normal range of motion and neck supple.  Skin:    General: Skin is warm and dry.  Neurological:     Mental Status:  She is alert and oriented to person, place, and time.     Motor: No abnormal muscle tone.     Comments: No dysarthria, aphasia or nystagmus.  Psychiatric:        Mood and Affect: Mood normal.        Behavior: Behavior normal.        Thought Content: Thought content normal.        Judgment: Judgment normal.    ED Results / Procedures / Treatments   Labs (all labs ordered are listed, but only abnormal results are displayed) Labs Reviewed  CBC WITH DIFFERENTIAL/PLATELET - Abnormal; Notable for the following components:      Result Value   Hemoglobin 15.6 (*)    HCT 46.9 (*)    All other components within normal limits  URINALYSIS, ROUTINE W REFLEX MICROSCOPIC - Abnormal; Notable for the following components:   APPearance CLOUDY (*)    Hgb urine dipstick SMALL (*)    Nitrite POSITIVE (*)    All other components within normal limits  TSH - Abnormal; Notable for the following components:   TSH 9.995 (*)    All other components within normal limits  URINALYSIS, MICROSCOPIC (REFLEX) - Abnormal; Notable for the following components:   Bacteria, UA MANY (*)    All other components within normal limits  BASIC METABOLIC PANEL    EKG None  Radiology CT HEAD WO CONTRAST (5MM)  Result Date: 02/20/2021 CLINICAL DATA:  Headache, new or worsening, posttraumatic. Additional history provided: Dizziness, fall. EXAM: CT HEAD WITHOUT CONTRAST CT CERVICAL SPINE WITHOUT CONTRAST TECHNIQUE: Multidetector CT imaging of the head and cervical spine was performed following the standard protocol without intravenous contrast. Multiplanar CT image reconstructions of the cervical spine were also generated. COMPARISON:  CT head/cervical spine 12/12/2020. FINDINGS: CT HEAD FINDINGS Brain: Redemonstrated foci of chronic encephalomalacia and gliosis within the bilateral frontal, parietal and occipital lobes, as well as anterior left temporal lobe. Background mild generalized parenchymal atrophy. Redemonstrated  chronic infarcts within the bilateral cerebellar hemispheres. There is no acute intracranial hemorrhage. No acute demarcated cortical infarct. No extra-axial fluid collection. No evidence of an intracranial mass. No midline shift. Vascular: No hyperdense vessel.  Atherosclerotic calcifications. Skull: Normal. Negative for fracture or focal lesion. Sinuses/Orbits: Visualized orbits show no acute finding. Trace mucosal thickening within the right frontoethmoidal recess and bilateral ethmoid sinuses. Other: Mild right periorbital soft tissue swelling/hematoma questioned. CT CERVICAL SPINE FINDINGS Alignment: Straightening of the expected cervical  lordosis. Skull base and vertebrae: The basion-dental and atlanto-dental intervals are maintained.No evidence of acute fracture to the cervical spine. Soft tissues and spinal canal: No prevertebral fluid or swelling. No visible canal hematoma. Disc levels: Cervical spondylosis with multilevel disc space narrowing, disc bulges/central disc protrusions, endplate spurring and uncovertebral hypertrophy. Multilevel ventral osteophytes, most prominent at C3-C4 and C4-C5. The ventral osteophyte at C4-C5 is bridging. No appreciable high-grade spinal canal stenosis. Bony neural foraminal narrowing on the left at C5-C6. Upper chest: No consolidation within the imaged lung apices. No visible pneumothorax. Centrilobular and paraseptal emphysema. Other: Incidentally noted small cervical ribs (right greater than left). 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 IMPRESSION: CT head: 1. No evidence of acute intracranial abnormality. 2. Mild right periorbital soft tissue swelling/hematoma questioned. 3. Redemonstrated foci of chronic encephalomalacia/gliosis within the bilateral frontal, parietal and occipital lobes, as well as anterior left temporal lobe. 4. Background mild generalized parenchymal atrophy. 5. Redemonstrated chronic bilateral cerebellar infarcts. CT cervical spine:  1. No evidence of acute fracture to the cervical spine. 2. Straightening of the expected cervical lordosis. 3. Cervical spondylosis, as described. Electronically Signed   By: Jackey Loge D.O.   On: 02/20/2021 09:41   CT Cervical Spine Wo Contrast  Result Date: 02/20/2021 CLINICAL DATA:  Headache, new or worsening, posttraumatic. Additional history provided: Dizziness, fall. EXAM: CT HEAD WITHOUT CONTRAST CT CERVICAL SPINE WITHOUT CONTRAST TECHNIQUE: Multidetector CT imaging of the head and cervical spine was performed following the standard protocol without intravenous contrast. Multiplanar CT image reconstructions of the cervical spine were also generated. COMPARISON:  CT head/cervical spine 12/12/2020. FINDINGS: CT HEAD FINDINGS Brain: Redemonstrated foci of chronic encephalomalacia and gliosis within the bilateral frontal, parietal and occipital lobes, as well as anterior left temporal lobe. Background mild generalized parenchymal atrophy. Redemonstrated chronic infarcts within the bilateral cerebellar hemispheres. There is no acute intracranial hemorrhage. No acute demarcated cortical infarct. No extra-axial fluid collection. No evidence of an intracranial mass. No midline shift. Vascular: No hyperdense vessel.  Atherosclerotic calcifications. Skull: Normal. Negative for fracture or focal lesion. Sinuses/Orbits: Visualized orbits show no acute finding. Trace mucosal thickening within the right frontoethmoidal recess and bilateral ethmoid sinuses. Other: Mild right periorbital soft tissue swelling/hematoma questioned. CT CERVICAL SPINE FINDINGS Alignment: Straightening of the expected cervical lordosis. Skull base and vertebrae: The basion-dental and atlanto-dental intervals are maintained.No evidence of acute fracture to the cervical spine. Soft tissues and spinal canal: No prevertebral fluid or swelling. No visible canal hematoma. Disc levels: Cervical spondylosis with multilevel disc space narrowing, disc  bulges/central disc protrusions, endplate spurring and uncovertebral hypertrophy. Multilevel ventral osteophytes, most prominent at C3-C4 and C4-C5. The ventral osteophyte at C4-C5 is bridging. No appreciable high-grade spinal canal stenosis. Bony neural foraminal narrowing on the left at C5-C6. Upper chest: No consolidation within the imaged lung apices. No visible pneumothorax. Centrilobular and paraseptal emphysema. Other: Incidentally noted small cervical ribs (right greater than left). 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 IMPRESSION: CT head: 1. No evidence of acute intracranial abnormality. 2. Mild right periorbital soft tissue swelling/hematoma questioned. 3. Redemonstrated foci of chronic encephalomalacia/gliosis within the bilateral frontal, parietal and occipital lobes, as well as anterior left temporal lobe. 4. Background mild generalized parenchymal atrophy. 5. Redemonstrated chronic bilateral cerebellar infarcts. CT cervical spine: 1. No evidence of acute fracture to the cervical spine. 2. Straightening of the expected cervical lordosis. 3. Cervical spondylosis, as described. Electronically Signed   By: Jackey Loge D.O.  On: 02/20/2021 09:41    Procedures Procedures   Medications Ordered in ED Medications  cephALEXin (KEFLEX) capsule 500 mg (500 mg Oral Given 02/20/21 2018)    ED Course  I have reviewed the triage vital signs and the nursing notes.  Pertinent labs & imaging results that were available during my care of the patient were reviewed by me and considered in my medical decision making (see chart for details).    MDM Rules/Calculators/A&P                            Patient Vitals for the past 24 hrs:  BP Temp Temp src Pulse Resp SpO2  02/20/21 2138 (!) 144/86 98.7 F (37.1 C) Oral 92 15 98 %  02/20/21 1930 (!) 156/109 -- -- (!) 104 20 100 %  02/20/21 1815 (!) 143/97 -- -- (!) 57 13 99 %  02/20/21 1745 (!) 140/91 -- -- (!) 59 17 97 %  02/20/21 1700  (!) 144/98 -- -- (!) 56 12 99 %  02/20/21 1615 (!) 151/90 -- -- 60 15 100 %  02/20/21 1347 (!) 153/99 98.5 F (36.9 C) Oral (!) 58 18 98 %  02/20/21 1126 (!) 151/98 97.7 F (36.5 C) Oral (!) 57 16 98 %  02/20/21 0812 (!) 152/103 98.8 F (37.1 C) -- 60 14 99 %    At time of discharge - reevaluation with update and discussion. After initial assessment and treatment, an updated evaluation reveals no further complaints, findings discussed and questions answered. Mancel BaleElliott Najah Liverman   Medical Decision Making:  This patient is presenting for evaluation of fall 2 days ago, which does require a range of treatment options, and is a complaint that involves a moderate risk of morbidity and mortality. The differential diagnoses include contusion, head injury, fractures, complications from chronic illness. I decided to review old records, and in summary middle-aged female presenting with recurrent fall, subacute, came today because of persistent headache.  She has received physical therapy at rehab recently.  I did not require additional historical information from anyone.  Clinical Laboratory Tests Ordered, included CBC and Metabolic panel. Review indicates normal findings. Radiologic Tests Ordered, included CT head and CT cervical spine.  I independently Visualized: Radiographic images, which show no acute abnormalities   Critical Interventions-clinical evaluation, laboratory testing, radiography, observation and reassessment  After These Interventions, the Patient was reevaluated and was found without acute injuries, or medical complications.  Patient wonders if she has thyroid problems because she has previously been treated for that and is not currently taking medications for thyroid disease.  TSH was sent.  CRITICAL CARE-no Performed by: Mancel BaleElliott Yola Paradiso  Nursing Notes Reviewed/ Care Coordinated Applicable Imaging Reviewed Interpretation of Laboratory Data incorporated into ED treatment  The  patient appears reasonably screened and/or stabilized for discharge and I doubt any other medical condition or other [facility] requiring further screening, evaluation, or treatment in the ED at this time prior to discharge.  Plan: Home Medications-continue usual; Home Treatments-rest, fluids; return here if the recommended treatment, does not improve the symptoms; Recommended follow up-PCP of choice soon as possible for further care and treatment     Final Clinical Impression(s) / ED Diagnoses Final diagnoses:  Dizziness  Hypothyroidism, unspecified type  Urinary tract infection without hematuria, site unspecified    Rx / DC Orders ED Discharge Orders          Ordered    levothyroxine (SYNTHROID) 100 MCG tablet  Daily before breakfast        02/20/21 2108    cephALEXin (KEFLEX) 500 MG capsule  4 times daily,   Status:  Discontinued        02/20/21 2114    cephALEXin (KEFLEX) 500 MG capsule  4 times daily        02/20/21 2117             Daleen Bo, MD 02/20/21 2312

## 2021-02-20 NOTE — ED Notes (Signed)
Notified provider of elevated BP.

## 2021-02-20 NOTE — ED Provider Notes (Signed)
Emergency Medicine Provider Triage Evaluation Note  Carrie Day , Day 45 y.o. female  was evaluated in triage.  Pt complains of dizziness, multiple falls.  States has been going on for months.  Last fall 2 days ago hit the frontal aspect of her head.  States she has had Day headache since.  Dizziness is random in nature.  She walks with Day walker at baseline.  No unilateral weakness, paresthesias.  No bowel or bladder incontinence.  Was previously in PT for this.  No recent illnesses, chest pain, shortness of breath. Non compliant with home meds  Review of Systems  Positive: Dizziness, falls Negative: CP, SOB, back pain, abd pain  Physical Exam  BP (!) 152/103 (BP Location: Left Arm)   Pulse 60   Temp 98.8 F (37.1 C)   Resp 14   LMP 09/06/2014   SpO2 99%  Gen:   Awake, no distress   Resp:  Normal effort  MSK:   Moves extremities without difficulty  Neuro:  CN grossly intact, moves Bl extremities without difficulty Other:    Medical Decision Making  Medically screening exam initiated at 8:16 AM.  Appropriate orders placed.  Carrie Day was informed that the remainder of the evaluation will be completed by another provider, this initial triage assessment does not replace that evaluation, and the importance of remaining in the ED until their evaluation is complete.  Dizziness, fall   Carrie Lewey A, PA-C 02/20/21 5400    Carrie Sleeper, MD 02/20/21 [phone]

## 2021-02-20 NOTE — ED Triage Notes (Signed)
Patient BIB [facility] with complaint of multiple falls over last two months after a change in her seizure medication, also reports that she thinks her seizure medications is making her high. Patient states she has a PCP appointment for 12/15 but wants to be evaluated earlier. Patient alert, oriented, and in no apparent distress at this time.  EMS Vitals BP 150/90 HR 58 SpO2 98% on room air CBG 122 RR 16

## 2021-02-20 NOTE — Discharge Instructions (Signed)
We are prescribing thyroid medicine for you and a antibiotic for UTI.  Follow-up with a primary care doctor soon as possible.  Use the resource guide if needed to help you find a doctor.

## 2021-02-20 NOTE — ED Notes (Signed)
Provided pt with crackers, peanut butter, and water.

## 2021-02-20 NOTE — ED Notes (Signed)
Unable to obtain signature at time of discharge due to computer in room not working. Pt verbalized understanding of instructions. Taken to exit in wheelchair due to unsteady gait consistent with baseline.

## 2021-06-04 ENCOUNTER — Other Ambulatory Visit: Payer: Self-pay | Admitting: Nurse Practitioner

## 2021-06-04 DIAGNOSIS — Z1231 Encounter for screening mammogram for malignant neoplasm of breast: Secondary | ICD-10-CM

## 2021-07-09 ENCOUNTER — Ambulatory Visit
Admission: RE | Admit: 2021-07-09 | Discharge: 2021-07-09 | Disposition: A | Discharge status: Home / Self Care | Payer: Medicaid Other | Source: Ambulatory Visit | Attending: Nurse Practitioner | Admitting: Nurse Practitioner

## 2021-07-09 DIAGNOSIS — Z1231 Encounter for screening mammogram for malignant neoplasm of breast: Secondary | ICD-10-CM

## 2022-01-01 ENCOUNTER — Ambulatory Visit (AMBULATORY_SURGERY_CENTER): Payer: Medicaid Other

## 2022-01-01 VITALS — Ht 62.0 in | Wt 133.0 lb

## 2022-01-01 DIAGNOSIS — Z1211 Encounter for screening for malignant neoplasm of colon: Secondary | ICD-10-CM

## 2022-01-01 DIAGNOSIS — Z01818 Encounter for other preprocedural examination: Secondary | ICD-10-CM

## 2022-01-01 MED ORDER — PLENVU 140 G PO SOLR: 1.0000 | Freq: Once | ORAL | 0 refills | Status: AC

## 2022-01-01 NOTE — Progress Notes (Signed)
No egg or soy allergy known to patient  No issues known to pt with past sedation with any surgeries or procedures Patient denies ever being told they had issues or difficulty with intubation  No FH of Malignant Hyperthermia Pt is not on diet pills Pt is not on  home 02  Pt is not on blood thinners  Pt reports issues with constipation  No A fib or A flutter Have any cardiac testing pending--denied Pt instructed to use [employer] or Good[employer] for a price reduction on prep   PV over phone. Pt had some difficulty remembering current meds due to her hx stroke and TBI. She requested instructions be mailed for her review and that she would call back for clarification or if questions arose. She did verbalize understanding of diet changes 5 days prior and 2 day prep plan.  Plenvu 2-day prep instructions mailed to verified address.

## 2022-01-14 ENCOUNTER — Telehealth: Payer: Self-pay | Admitting: Gastroenterology

## 2022-01-14 NOTE — Telephone Encounter (Signed)
Spoke with pt. Reassured re: meds with clear liquids am of procedure (before 3hrs before procedure)

## 2022-01-14 NOTE — Telephone Encounter (Signed)
Patient has questions regarding her medications and the prep.

## 2022-01-15 ENCOUNTER — Encounter: Payer: Medicaid Other | Admitting: Gastroenterology

## 2022-01-15 NOTE — Progress Notes (Deleted)
Patient arrived for scheduled procedure.  Care partner stated he is unable to stay.  Explained to patient and care partner we were unable to do procedure without anyone here.  They agreed to try to arrange someone else to come.  On check in patient stated she had just drank a cup of water 30 minutes ago.  After explaining the risks involved she also stated she had smoked marijuana within the last couple of hours.  Patient and care partner agreed to reschedule.  On trying to print instuctions for next scheduled procedure the patient and care partner said they would just call back to reschedule.  They were unable to wait for instructions today.

## 2022-01-17 IMAGING — CT CT CERVICAL SPINE W/O CM
3 of 4 series · 9 of 33 positions shown, 10 images · non-contrast
Comparison: CT head/cervical spine 12/12/2020.

CLINICAL DATA: Headache, new or worsening, posttraumatic.
Additional history provided: Dizziness, fall.

EXAM:
CT HEAD WITHOUT CONTRAST
CT CERVICAL SPINE WITHOUT CONTRAST
TECHNIQUE: Multidetector CT imaging of the head and cervical spine was
performed following the standard protocol without intravenous
contrast. Multiplanar CT image reconstructions of the cervical spine
were also generated.

[Series 7: sag bone · sagittal · 0.24mm/px · 5 of 62 slices shown]
[im 21/62  bone]
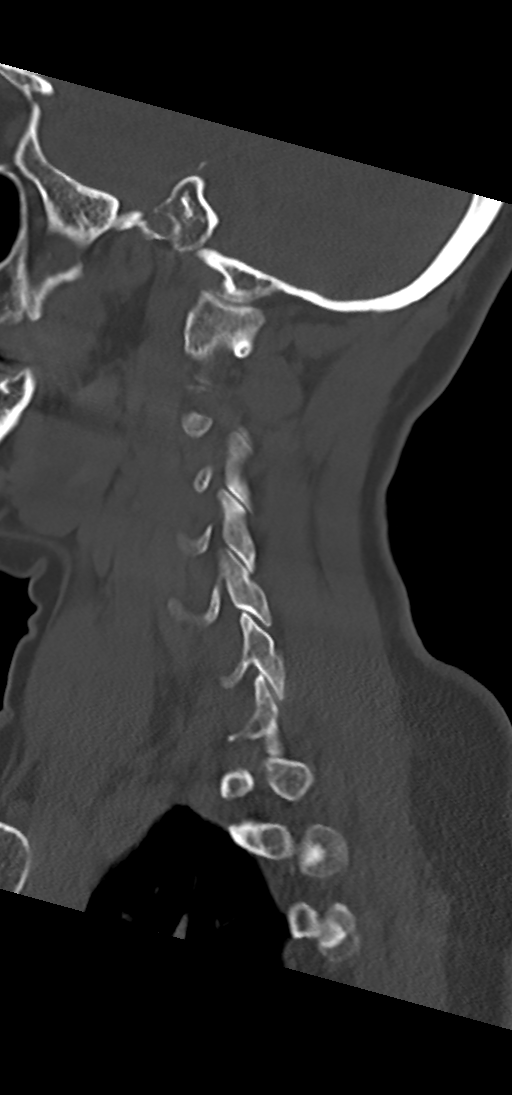
[im 26/62  bone]
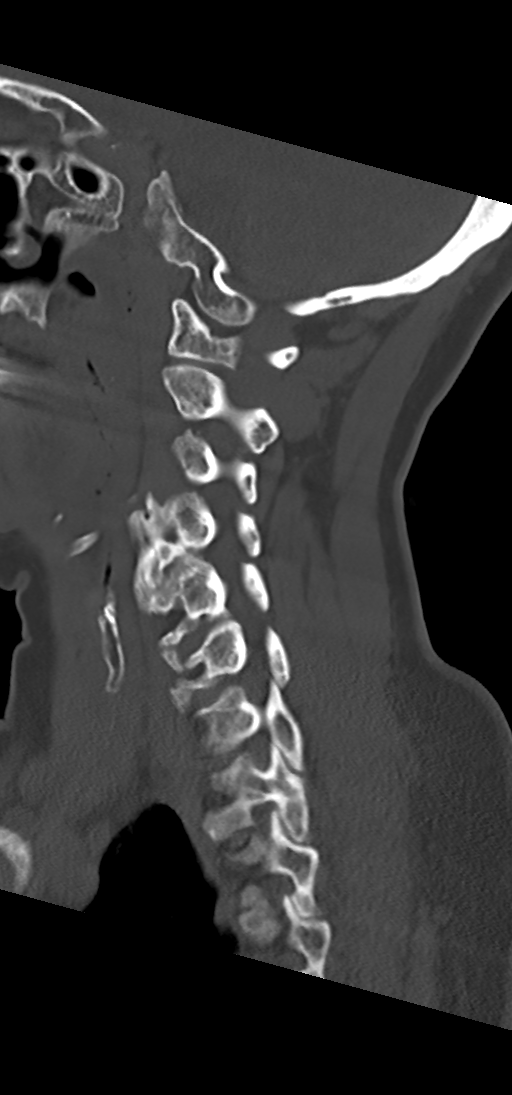
[im 31/62  bone]
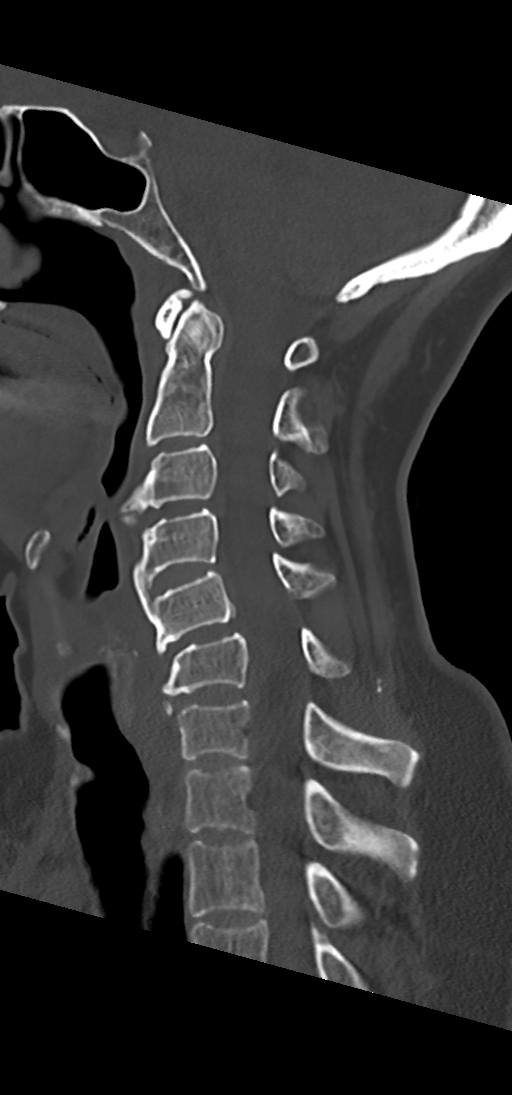
[im 36/62  bone]
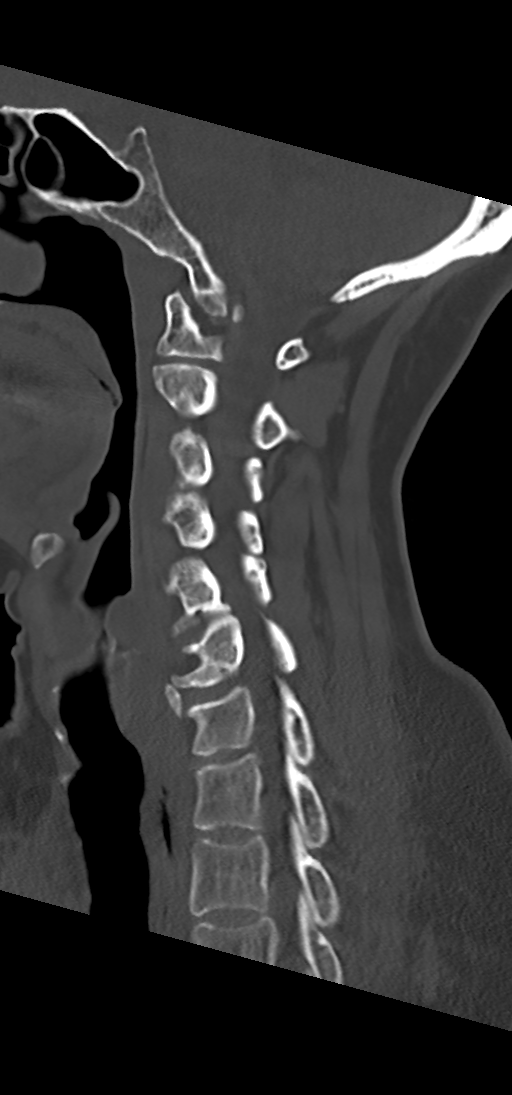
[im 41/62  bone]
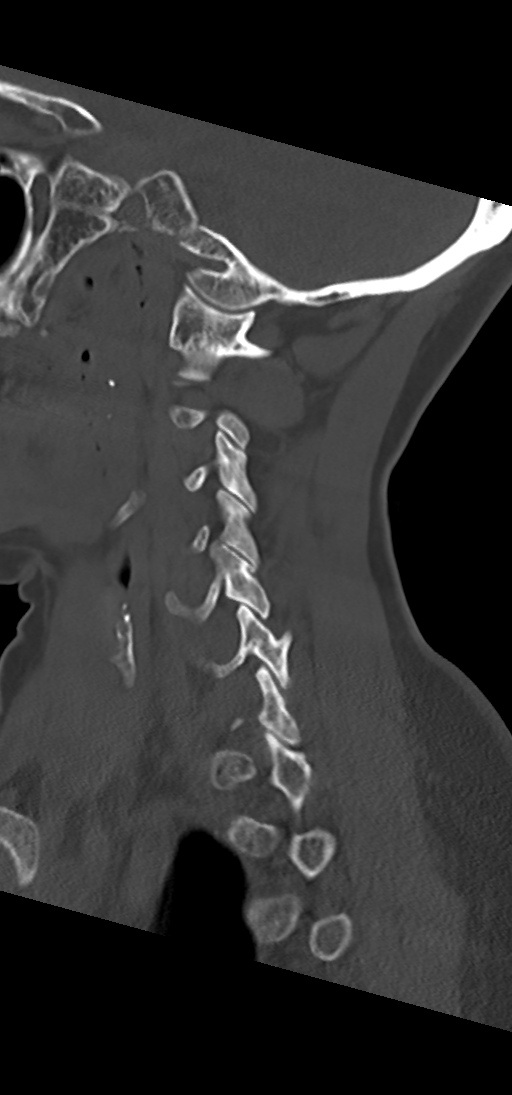

[Series 8: cor bone · coronal · 0.24mm/px · 3 of 74 slices shown]
[im 15/74  bone]
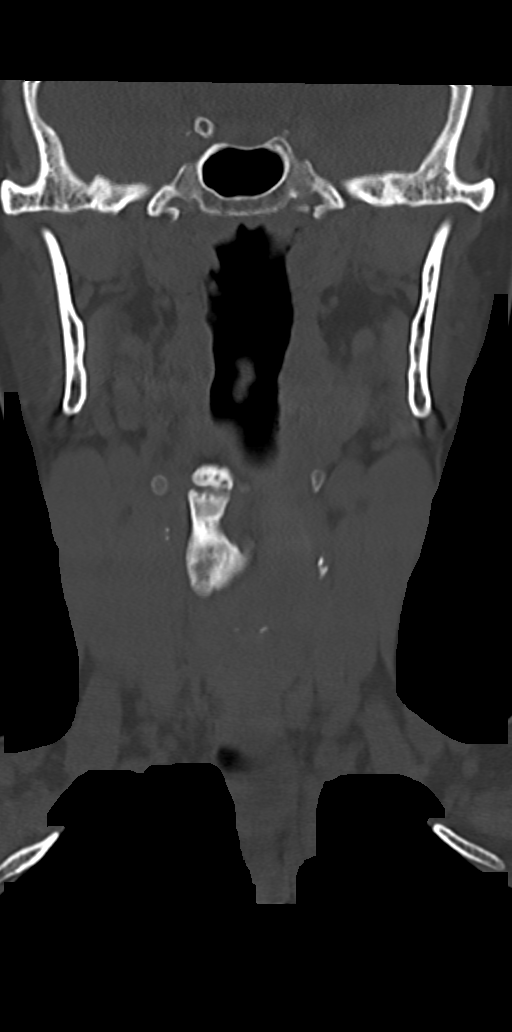
[im 30/74  bone]
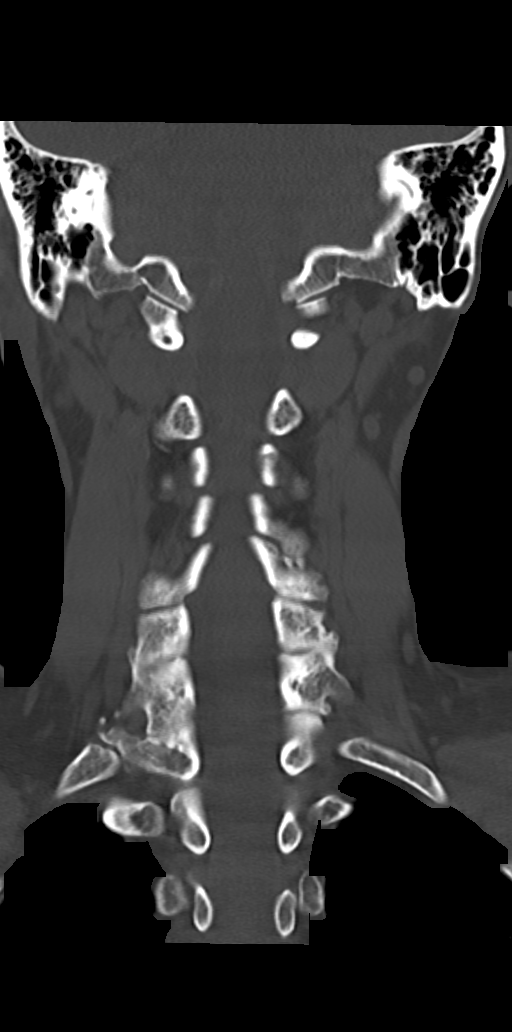
[im 44/74  bone]
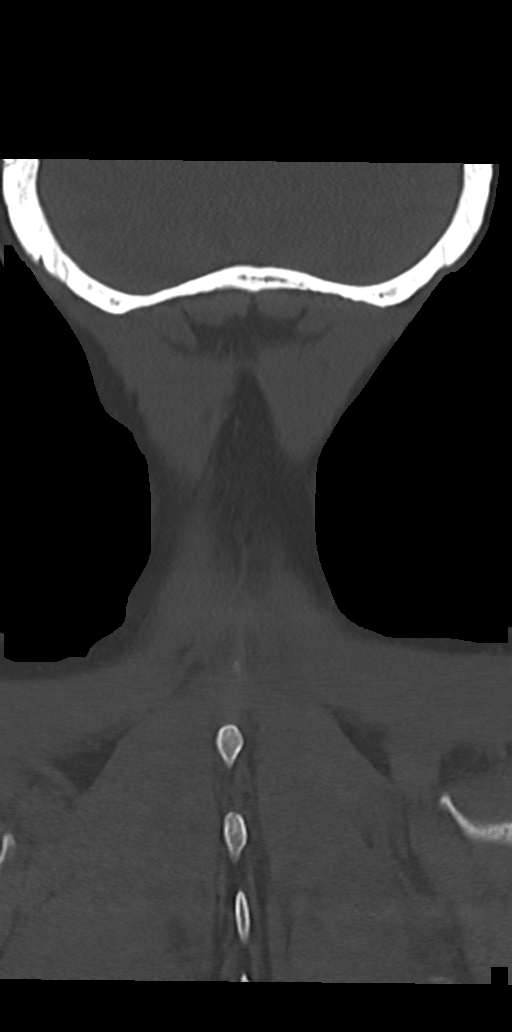

[Series 10: orthogonal axials · axial · 0.21mm/px · z∈[-275,-275]mm · 1 of 91 slices shown, 2 images]
[im 46/91  soft-tissue]
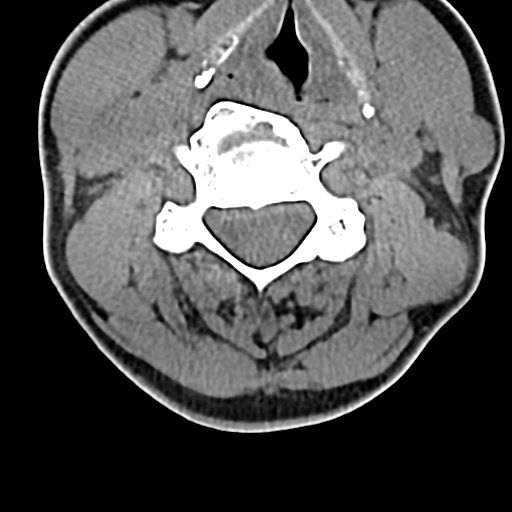
[im 46/91  bone]
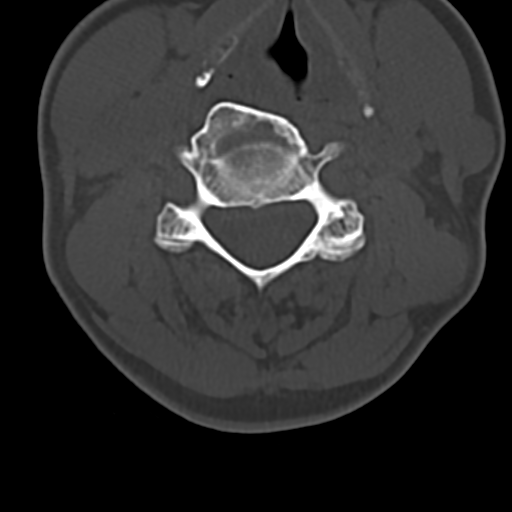

[9 of 33 positions shown; findings below may reference images not displayed]

FINDINGS: CT HEAD FINDINGS

Brain:

Redemonstrated foci of chronic encephalomalacia and gliosis within
the bilateral frontal, parietal and occipital lobes, as well as
anterior left temporal lobe.

Background mild generalized parenchymal atrophy.

Redemonstrated chronic infarcts within the bilateral cerebellar
hemispheres.

There is no acute intracranial hemorrhage.

No acute demarcated cortical infarct.

No extra-axial fluid collection.

No evidence of an intracranial mass.

No midline shift.

Vascular: No hyperdense vessel.  Atherosclerotic calcifications.

Skull: Normal. Negative for fracture or focal lesion.

Sinuses/Orbits: Visualized orbits show no acute finding. Trace
mucosal thickening within the right frontoethmoidal recess and
bilateral ethmoid sinuses.

Other: Mild right periorbital soft tissue swelling/hematoma
questioned.

CT CERVICAL SPINE FINDINGS

Alignment: Straightening of the expected cervical lordosis.

Skull base and vertebrae: The basion-dental and atlanto-dental
intervals are maintained.No evidence of acute fracture to the
cervical spine.

Soft tissues and spinal canal: No prevertebral fluid or swelling. No
visible canal hematoma.

Disc levels: Cervical spondylosis with multilevel disc space
narrowing, disc bulges/central disc protrusions, endplate spurring
and uncovertebral hypertrophy. Multilevel ventral osteophytes, most
prominent at C3-C4 and C4-C5. The ventral osteophyte at C4-C5 is
bridging. No appreciable high-grade spinal canal stenosis. Bony
neural foraminal narrowing on the left at C5-C6.

Upper chest: No consolidation within the imaged lung apices. No
visible pneumothorax. Centrilobular and paraseptal emphysema.

Other: Incidentally noted small cervical ribs (right greater than
left). Fusion of the right first and second posterior ribs.
IMPRESSION: CT head:

1. No evidence of acute intracranial abnormality.
2. Mild right periorbital soft tissue swelling/hematoma questioned.
3. Redemonstrated foci of chronic encephalomalacia/gliosis within
the bilateral frontal, parietal and occipital lobes, as well as
anterior left temporal lobe.
4. Background mild generalized parenchymal atrophy.
5. Redemonstrated chronic bilateral cerebellar infarcts.

CT cervical spine:

1. No evidence of acute fracture to the cervical spine.
2. Straightening of the expected cervical lordosis.
3. Cervical spondylosis, as described.

## 2022-03-09 ENCOUNTER — Encounter: Payer: Medicaid Other | Admitting: Gastroenterology

## 2022-04-21 ENCOUNTER — Encounter: Payer: Self-pay | Admitting: Nurse Practitioner
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
## 2022-05-18 ENCOUNTER — Ambulatory Visit (INDEPENDENT_AMBULATORY_CARE_PROVIDER_SITE_OTHER): Payer: Medicaid Other | Admitting: Nurse Practitioner

## 2022-05-18 ENCOUNTER — Encounter: Payer: Self-pay | Admitting: Nurse Practitioner

## 2022-05-18 VITALS — BP 130/88 | HR 60 | Ht 62.0 in | Wt 137.0 lb

## 2022-05-18 DIAGNOSIS — K5900 Constipation, unspecified: Secondary | ICD-10-CM

## 2022-05-18 DIAGNOSIS — Z1211 Encounter for screening for malignant neoplasm of colon: Secondary | ICD-10-CM | POA: Diagnosis not present

## 2022-05-18 MED ORDER — CLENPIQ 10-3.5-12 MG-GM -GM/160ML PO SOLN: 1.0000 | ORAL | 0 refills | Status: DC

## 2022-05-18 NOTE — Patient Instructions (Addendum)
You have been scheduled for a colonoscopy. Please follow written instructions given to you at your visit today.  Please pick up your prep supplies at the pharmacy within the next 1-3 days. If you use inhalers (even only as needed), please bring them with you on the day of your procedure.  Miralax- every night as needed  Ex-lax - take 1-2 tablets by mouth every 3rd night  Due to recent changes in healthcare laws, you may see the results of your imaging and laboratory studies on MyChart before your provider has had a chance to review them.  We understand that in some cases there may be results that are confusing or concerning to you. Not all laboratory results come back in the same time frame and the provider may be waiting for multiple results in order to interpret others.  Please give [place] 48 hours in order for your provider to thoroughly review all the results before contacting the office for clarification of your results.    Thank you for trusting me with your gastrointestinal care!   Carl Best, CRNP

## 2022-05-30 NOTE — Progress Notes (Signed)
Agree with assessment/plan.  Raj Reagann Dolce, MD [facility] [phone]  

## 2022-06-01 ENCOUNTER — Ambulatory Visit: Payer: Medicaid Other | Admitting: Obstetrics & Gynecology

## 2022-06-05 IMAGING — MG MM DIGITAL SCREENING BILAT W/ TOMO AND CAD
6 of 10 series · 6 of 30 positions shown · non-contrast
Comparison: None.

CLINICAL DATA: Screening.

EXAM:
DIGITAL SCREENING BILATERAL MAMMOGRAM WITH TOMOSYNTHESIS AND CAD
TECHNIQUE: Bilateral screening digital craniocaudal and mediolateral oblique
mammograms were obtained. Bilateral screening digital breast
tomosynthesis was performed. The images were evaluated with
computer-aided detection.

[R MLO synth-2D (1 of 2)]
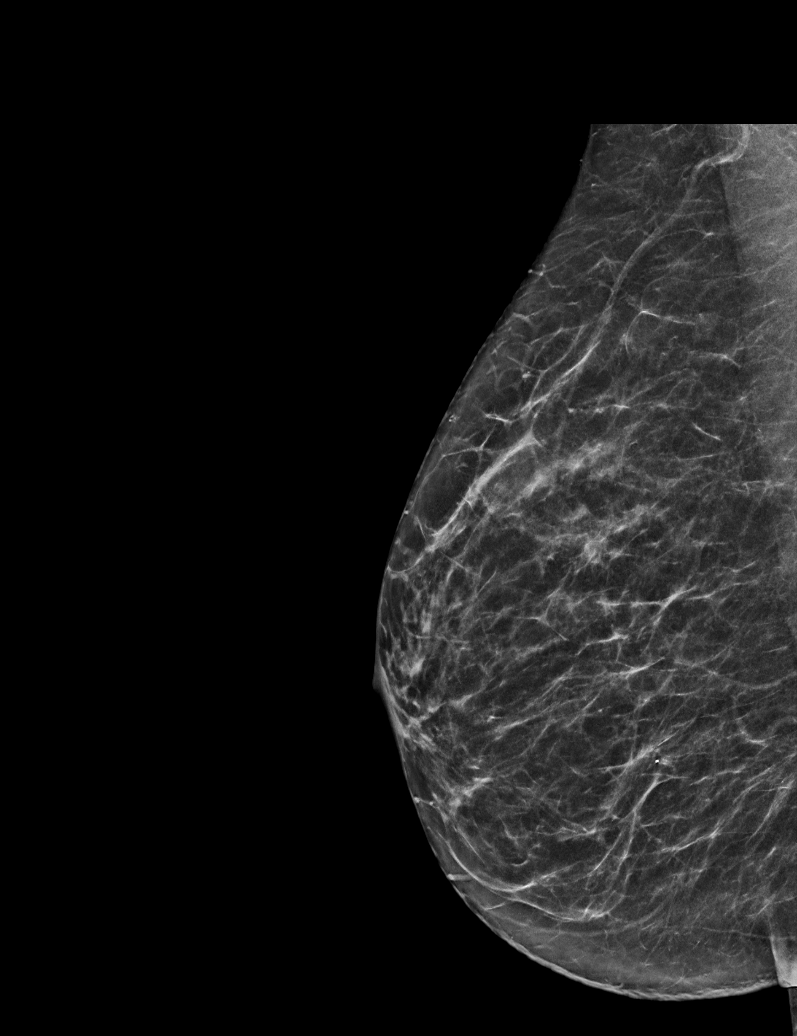

[R MLO synth-2D (2 of 2)]
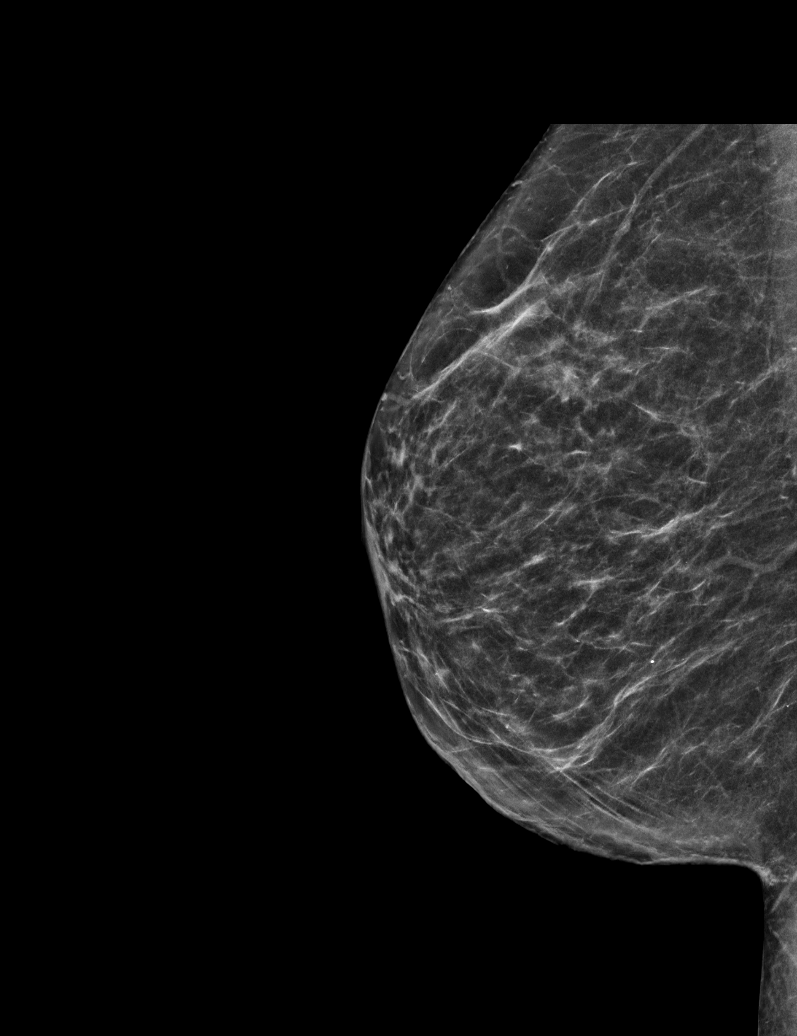

[R CC synth-2D]
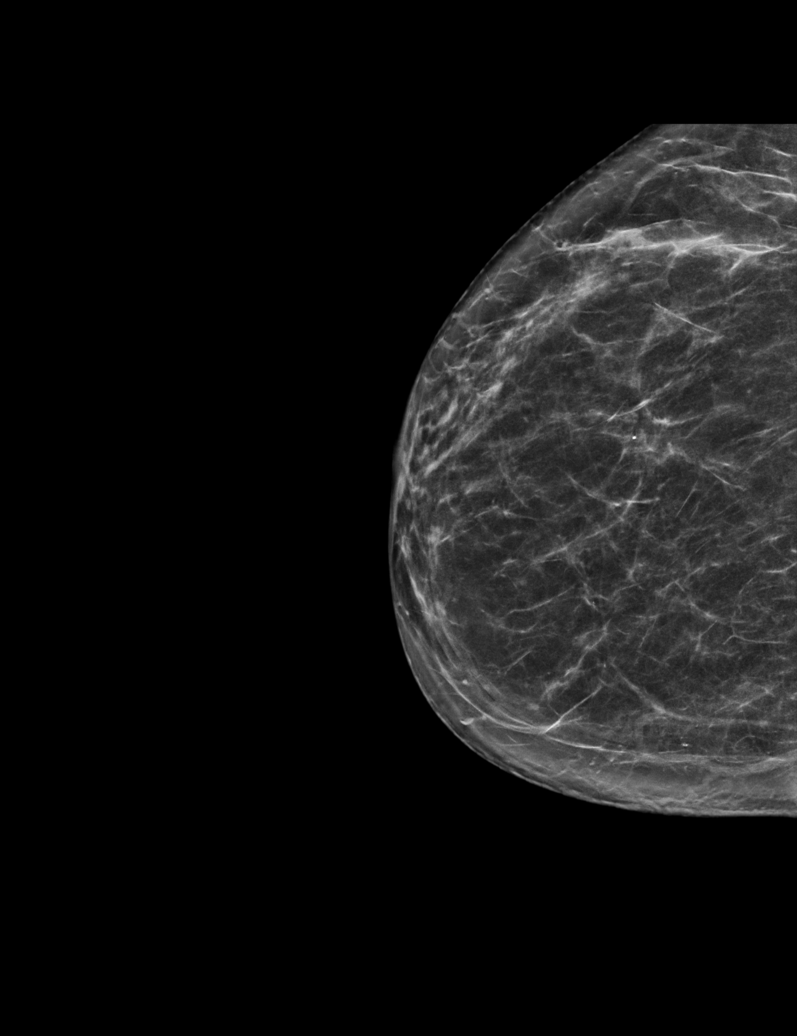

[L CC synth-2D]
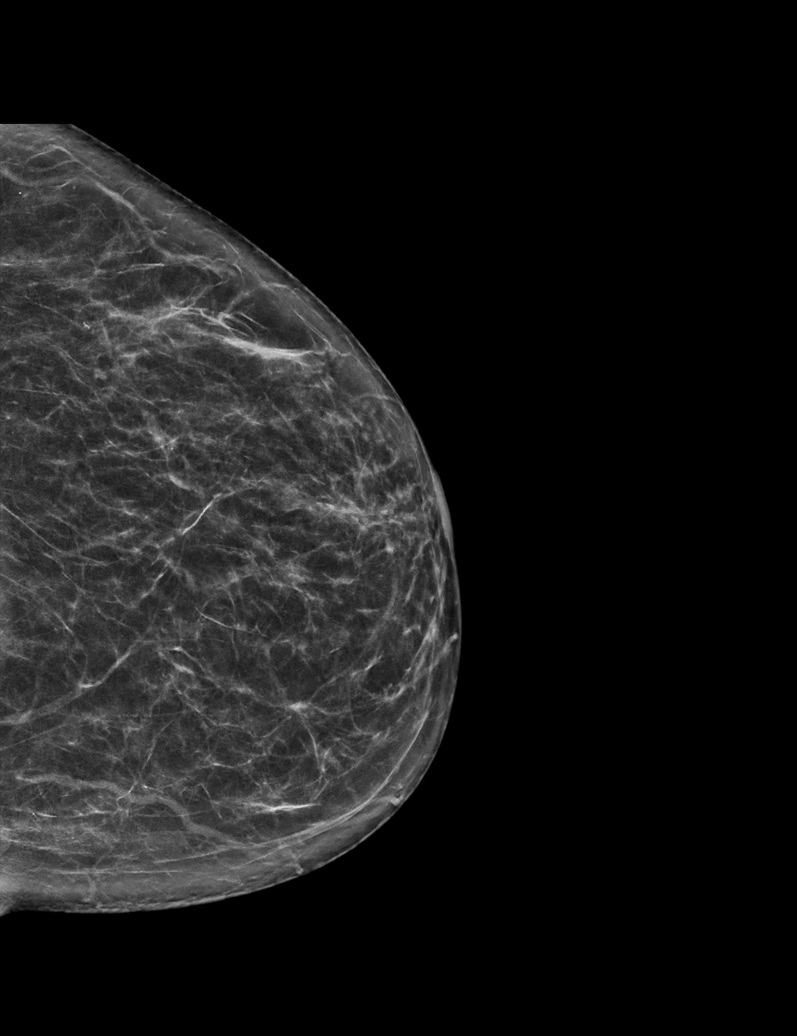

[L MLO synth-2D]
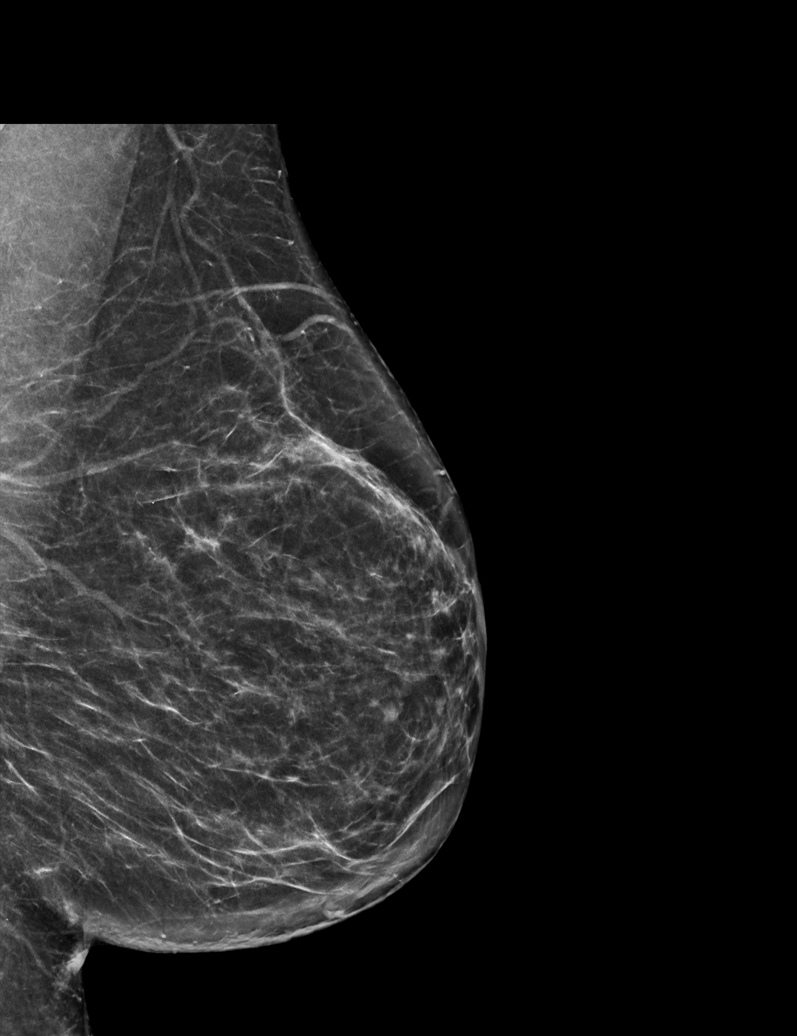

[L CC tomo · tomo slice 30/59.0]
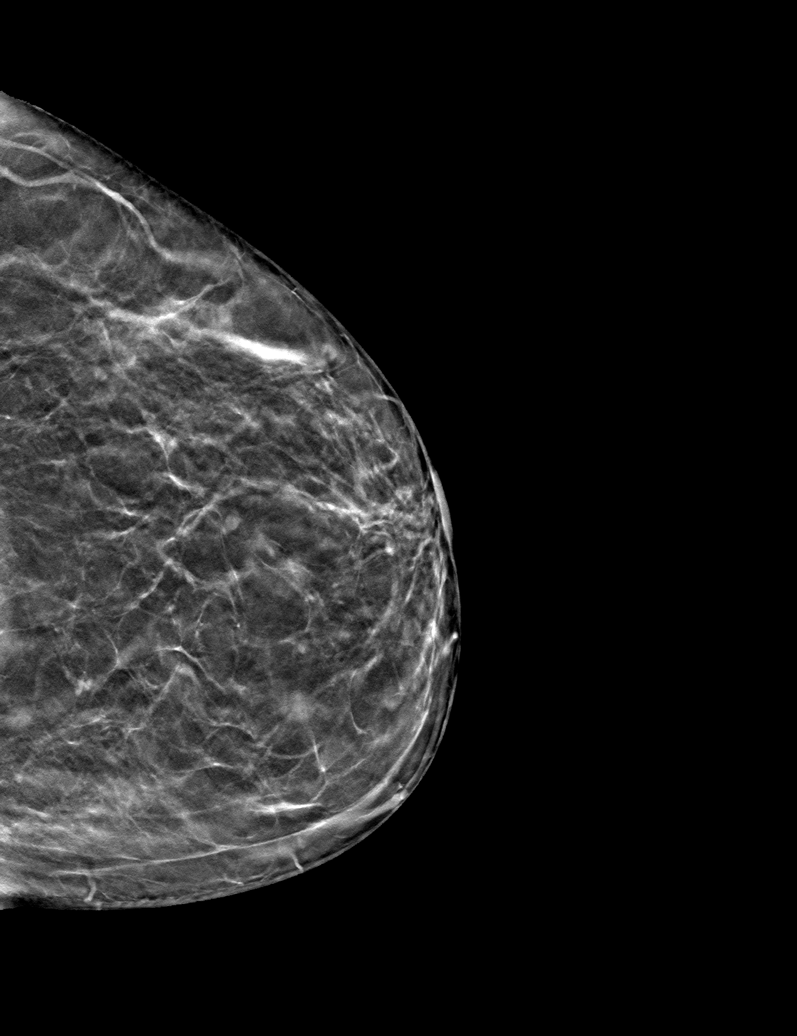

[6 of 30 positions shown; findings below may reference images not displayed]

ACR Breast Density Category b: There are scattered areas of
fibroglandular density.
FINDINGS: There are no findings suspicious for malignancy.
IMPRESSION: No mammographic evidence of malignancy. A result letter of this
screening mammogram will be mailed directly to the patient.

RECOMMENDATION:
Screening mammogram in one year. (Code:XG-X-X7B)

BI-RADS CATEGORY  1: Negative.

## 2022-07-07 ENCOUNTER — Encounter: Payer: Self-pay | Admitting: Gastroenterology

## 2022-07-07 ENCOUNTER — Ambulatory Visit (AMBULATORY_SURGERY_CENTER): Payer: Medicaid Other | Admitting: Gastroenterology

## 2022-07-07 VITALS — BP 130/88 | HR 47 | Temp 97.8°F | Resp 17 | Ht 62.0 in | Wt 137.0 lb

## 2022-07-07 DIAGNOSIS — Z1211 Encounter for screening for malignant neoplasm of colon: Secondary | ICD-10-CM | POA: Diagnosis not present

## 2022-07-07 DIAGNOSIS — K635 Polyp of colon: Secondary | ICD-10-CM | POA: Diagnosis not present

## 2022-07-07 DIAGNOSIS — D128 Benign neoplasm of rectum: Secondary | ICD-10-CM

## 2022-07-07 MED ORDER — SODIUM CHLORIDE 0.9 % IV SOLN
500.0000 mL | Freq: Once | INTRAVENOUS | Status: DC
Start: 2022-07-07 — End: 2022-07-07

## 2022-07-07 NOTE — Progress Notes (Signed)
Uneventful anesthetic. Report to pacu rn. Vss. Care resumed by rn. 

## 2022-07-07 NOTE — Progress Notes (Signed)
Called to room to assist during endoscopic procedure.  Patient ID and intended procedure confirmed with present staff. Received instructions for my participation in the procedure from the performing physician.  
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
## 2022-07-07 NOTE — Op Note (Addendum)
[facility] Patient Name: Carrie Day Procedure Date: 07/07/2022 11:17 AM MRN: [id number] Endoscopist: Lynann Bologna , MD, [id number] Age: 47 Referring MD:  Date of Birth: 1975-05-08 Gender: Female Account #: [id number] Procedure:                Colonoscopy Indications:              Screening for colorectal malignant neoplasm. H/O                            constipation Medicines:                Monitored Anesthesia Care Procedure:                Pre-Anesthesia Assessment:                           - Prior to the procedure, a History and Physical                            was performed, and patient medications and                            allergies were reviewed. The patient's tolerance of                            previous anesthesia was also reviewed. The risks                            and benefits of the procedure and the sedation                            options and risks were discussed with the patient.                            All questions were answered, and informed consent                            was obtained. Prior Anticoagulants: The patient has                            taken no anticoagulant or antiplatelet agents. ASA                            Grade Assessment: II - A patient with mild systemic                            disease. After reviewing the risks and benefits,                            the patient was deemed in satisfactory condition to                            undergo the procedure.  After obtaining informed consent, the colonoscope                            was passed under direct vision. Throughout the                            procedure, the patient's blood pressure, pulse, and                            oxygen saturations were monitored continuously. The                            Olympus [id number]-[id number]) Colonoscope was                            introduced through the anus and advanced to  the the                            cecum, identified by appendiceal orifice and                            ileocecal valve. The colonoscopy was performed                            without difficulty. The patient tolerated the                            procedure well. The quality of the bowel                            preparation was good. The ileocecal valve,                            appendiceal orifice, and rectum were photographed. Scope In: 11:24:10 AM Scope Out: 11:39:56 AM Scope Withdrawal Time: 0 hours 9 minutes 11 seconds  Total Procedure Duration: 0 hours 15 minutes 46 seconds  Findings:                 A few rare (1-2) small-mouthed diverticula were                            found in the sigmoid colon.                           Non-bleeding internal hemorrhoids were found during                            retroflexion. The hemorrhoids were small and Grade                            I (internal hemorrhoids that do not prolapse).                           Two sessile polyps were found in the rectum. The  polyps were 2 to 4 mm in size. These polyps were                            removed with a cold snare. Resection and retrieval                            were complete. The colon was highly redundant. Complications:            No immediate complications. Estimated Blood Loss:     Estimated blood loss: none. Estimated blood loss:                            none. Impression:               - Diminutive colonic polyps s/p polypectomy.                           - Minimal sigmoid diverticulosis.                           - The GI [facility] (intelligent endoscopy module),                            computer-aided polyp detection system powered by AI                            was utilized to detect colorectal polyps through                            enhanced visualization during colonoscopy. Recommendation:           - Patient has a contact number available  for                            emergencies. The signs and symptoms of potential                            delayed complications were discussed with the                            patient. Return to normal activities tomorrow.                            Written discharge instructions were provided to the                            patient.                           - High fiber diet.                           - Continue present medications.                           - For constipation, increase MiraLAX to 17 g p.o.  twice daily, increase water intake, minimize pain                            medications including nonsteroidals.                           - Await pathology results.                           - Repeat colonoscopy for surveillance based on                            pathology results.                           - Return to GI clinic PRN. Lynann Bologna, MD 07/07/2022 11:46:15 AM This report has been signed electronically.

## 2022-07-07 NOTE — Progress Notes (Signed)
VS completed by MO.  Pt's states no medical or surgical changes since previsit or office visit.  

## 2022-07-07 NOTE — Patient Instructions (Signed)
Await pathology results. Continue present medications. For constipation, increase MiraLax to 17G by mouth, twice daily.  Increase water intake. High fiber diet.  Handouts on polyps, hemorrhoids, diverticulosis and high fiber diet provided.  YOU HAD AN ENDOSCOPIC PROCEDURE TODAY AT THE [facility]:   Refer to the procedure report that was given to you for any specific questions about what was found during the examination.  If the procedure report does not answer your questions, please call your gastroenterologist to clarify.  If you requested that your care partner not be given the details of your procedure findings, then the procedure report has been included in a sealed envelope for you to review at your convenience later.  YOU SHOULD EXPECT: Some feelings of bloating in the abdomen. Passage of more gas than usual.  Walking can help get rid of the air that was put into your GI tract during the procedure and reduce the bloating. If you had a lower endoscopy (such as a colonoscopy or flexible sigmoidoscopy) you may notice spotting of blood in your stool or on the toilet paper. If you underwent a bowel prep for your procedure, you may not have a normal bowel movement for a few days.  Please Note:  You might notice some irritation and congestion in your nose or some drainage.  This is from the oxygen used during your procedure.  There is no need for concern and it should clear up in a day or so.  SYMPTOMS TO REPORT IMMEDIATELY:  Following lower endoscopy (colonoscopy or flexible sigmoidoscopy):  Excessive amounts of blood in the stool  Significant tenderness or worsening of abdominal pains  Swelling of the abdomen that is new, acute  Fever of 100F or higher   For urgent or emergent issues, a gastroenterologist can be reached at any hour by calling [phone]. Do not use MyChart messaging for urgent concerns.    DIET:  We do recommend a small meal at first, but then you may  proceed to your regular diet.  Drink plenty of fluids but you should avoid alcoholic beverages for 24 hours.  ACTIVITY:  You should plan to take it easy for the rest of today and you should NOT DRIVE or use heavy machinery until tomorrow (because of the sedation medicines used during the test).    FOLLOW UP: Our staff will call the number listed on your records the next business day following your procedure.  We will call around 7:15- 8:00 am to check on you and address any questions or concerns that you may have regarding the information given to you following your procedure. If we do not reach you, we will leave a message.     If any biopsies were taken you will be contacted by phone or by letter within the next 1-3 weeks.  Please call us at [phone] if you have not heard about the biopsies in 3 weeks.    SIGNATURES/CONFIDENTIALITY: You and/or your care partner have signed paperwork which will be entered into your electronic medical record.  These signatures attest to the fact that that the information above on your After Visit Summary has been reviewed and is understood.  Full responsibility of the confidentiality of this discharge information lies with you and/or your care-partner.

## 2022-07-08 ENCOUNTER — Telehealth: Payer: Self-pay | Admitting: *Deleted

## 2022-07-08 NOTE — Telephone Encounter (Signed)
  Follow up Call-     07/07/2022   10:56 AM  Call back number  Post procedure Call Back phone  # [phone]  Permission to leave phone message Yes     Patient questions:  Do you have a fever, pain , or abdominal swelling? No. Pain Score  0 *  Have you tolerated food without any problems? Yes.    Have you been able to return to your normal activities? Yes.    Do you have any questions about your discharge instructions: Diet   No. Medications  No. Follow up visit  No.  Do you have questions or concerns about your Care? No.  Actions: * If pain score is 4 or above: No action needed, pain <4.

## 2022-07-15 ENCOUNTER — Encounter: Payer: Self-pay | Admitting: Gastroenterology

## 2022-07-16 ENCOUNTER — Other Ambulatory Visit (HOSPITAL_COMMUNITY)
Admission: RE | Admit: 2022-07-16 | Discharge: 2022-07-16 | Disposition: A | Discharge status: Home / Self Care | Payer: Medicaid Other | Source: Ambulatory Visit | Attending: Obstetrics & Gynecology | Admitting: Obstetrics & Gynecology

## 2022-07-16 ENCOUNTER — Ambulatory Visit (INDEPENDENT_AMBULATORY_CARE_PROVIDER_SITE_OTHER): Payer: Medicaid Other | Admitting: Obstetrics & Gynecology

## 2022-07-16 ENCOUNTER — Encounter: Payer: Self-pay | Admitting: Obstetrics & Gynecology

## 2022-07-16 VITALS — BP 163/94 | HR 49 | Ht 62.5 in | Wt 140.1 lb

## 2022-07-16 DIAGNOSIS — N760 Acute vaginitis: Secondary | ICD-10-CM | POA: Insufficient documentation

## 2022-07-16 DIAGNOSIS — N763 Subacute and chronic vulvitis: Secondary | ICD-10-CM | POA: Diagnosis not present

## 2022-07-16 DIAGNOSIS — Z01419 Encounter for gynecological examination (general) (routine) without abnormal findings: Secondary | ICD-10-CM | POA: Diagnosis present

## 2022-07-16 MED ORDER — CLOBETASOL PROPIONATE 0.05 % EX OINT
TOPICAL_OINTMENT | CUTANEOUS | 5 refills | Status: AC
Start: 2022-07-16 — End: ?

## 2022-07-16 MED ORDER — FLUCONAZOLE 150 MG PO TABS
150.0000 mg | ORAL_TABLET | Freq: Once | ORAL | 0 refills | Status: AC
Start: 2022-07-16 — End: 2022-07-16

## 2022-07-16 NOTE — Addendum Note (Signed)
Addended by: Hamilton Capri on: 07/16/2022 05:02 PM   Modules accepted: Orders

## 2022-07-16 NOTE — Progress Notes (Signed)
Patient ID: Carrie Day, female   DOB: 04/09/1975, 48 y.o.   MRN: [id number]  Chief Complaint  Patient presents with   New Patient (Initial Visit)    HPI Carrie Day is a 47 y.o. female.  [id number] LMP 4 years ago, notes vaginal vulvar itching for 1 year that did not resolve when treated for yeast and BV. 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    HLD (hyperlipidemia)    Hypertension    Legally blind    Seizures    Stroke    Thyroid disease     Past Surgical History:  Procedure Laterality Date   CESAREAN SECTION  08/2012   JEJUNOSTOMY FEEDING TUBE     SPLENECTOMY     TRACHEOSTOMY      Family History  Problem Relation Age of Onset   Colon cancer Maternal Aunt    Breast cancer Paternal Aunt    Esophageal cancer Neg Hx    Rectal cancer Neg Hx    Stomach cancer Neg Hx     Social History Social History   Tobacco Use   Smoking status: Former    Types: Cigarettes    Quit date: 04/2022    Years since quitting: 0.2   Smokeless tobacco: Never   Tobacco comments:    3-4 cigs/day  Vaping Use   Vaping Use: Never used  Substance Use Topics   Alcohol use: Yes    Comment: occ   Drug use: Yes    Types: Marijuana    Comment: last time used 2 days ago    Allergies  Allergen Reactions   Penicillins Other (See Comments)    Unknown     Current Outpatient Medications  Medication Sig Dispense Refill   aspirin 81 MG chewable tablet Chew 81 mg by mouth daily.     atorvastatin (LIPITOR) 10 MG tablet Take 10 mg by mouth daily.     baclofen (LIORESAL) 10 MG tablet Take 10 mg by mouth 3 (three) times daily.     clobetasol ointment (TEMOVATE) 0.05 % Apply to affected area every night for 4 weeks, then every other day for 4 weeks and then twice a week for 4 weeks or until resolution. 30 g 5   fluconazole (DIFLUCAN) 150 MG tablet Take 1 tablet (150 mg total) by mouth once for 1 dose. And  repeat in one week 1 tablet 0   levothyroxine (SYNTHROID) 100 MCG tablet Take 1 tablet (100 mcg total) by mouth daily before breakfast. 30 tablet 1   phenytoin (DILANTIN) 100 MG ER capsule 100 mg 3 (three) times daily     polyethylene glycol powder (GLYCOLAX/MIRALAX) 17 GM/SCOOP powder Take 17 g by mouth daily.     valsartan (DIOVAN) 40 MG tablet Take 40 mg by mouth daily.     Vitamin D, Ergocalciferol, (DRISDOL) 1.25 MG (50000 UNIT) CAPS capsule Take 50,000 Units by mouth once a week.     amLODipine (NORVASC) 10 MG tablet Take 10 mg by mouth daily.     No current facility-administered medications for this visit.    Review of Systems Review of Systems  Constitutional: Negative.   Respiratory: Negative.    Cardiovascular: Negative.   Genitourinary:  Positive for dyspareunia and vaginal pain (vulvar irritation). Negative for dysuria, frequency, pelvic pain, vaginal bleeding and vaginal discharge.    Blood pressure (!) 163/94, pulse (!) 49,  height 5' 2.5" (1.588 m), weight 140 lb 1.6 oz (63.5 kg), last menstrual period 09/03/2014.  Physical Exam Physical Exam Vitals and nursing note reviewed. Exam conducted with a chaperone present.  Constitutional:      Appearance: Normal appearance. She is not ill-appearing.  HENT:     Head: Normocephalic and atraumatic.  Pulmonary:     Effort: Pulmonary effort is normal.  Abdominal:     General: Abdomen is flat.  Genitourinary:    Exam position: Lithotomy position.     Vagina: Vaginal discharge (white) present.     Cervix: Normal.     Uterus: Normal.      Adnexa: Right adnexa normal and left adnexa normal.       Comments: Area of pale skin c/w chronic inflammation Musculoskeletal:        General: Normal range of motion.  Skin:    General: Skin is warm and dry.  Neurological:     Mental Status: She is alert.  Psychiatric:        Mood and Affect: Mood normal.     Comments: Slow speech     Data  Reviewed   Assessment Vulvovaginitis - Plan: fluconazole (DIFLUCAN) 150 MG tablet, clobetasol ointment (TEMOVATE) 0.05 %  Chronic vulvitis   Plan Rx as noted for 4 weeks RTC to reexamine and assess for bx if not improved    Scheryl Darter 07/16/2022, 9:15 AM

## 2022-07-16 NOTE — Progress Notes (Signed)
Patient presents as a New Patient from [facility] for vaginal itching. She has been treated for with Diflucan and terconazole cream. No relief of symptoms. She states that she has a rash on her buttocks and itching is external and internal. Also reports having pain with intercourse.

## 2022-07-20 LAB — CERVICOVAGINAL ANCILLARY ONLY
Bacterial Vaginitis (gardnerella): NEGATIVE
Candida Glabrata: NEGATIVE
Candida Vaginitis: POSITIVE — AB
Chlamydia: NEGATIVE
Comment: NEGATIVE
Comment: NEGATIVE
Comment: NEGATIVE
Comment: NEGATIVE
Comment: NEGATIVE
Comment: NORMAL
Neisseria Gonorrhea: NEGATIVE
Trichomonas: NEGATIVE

## 2022-07-24 LAB — CYTOLOGY - PAP
Comment: NEGATIVE
Diagnosis: UNDETERMINED — AB
High risk HPV: POSITIVE — AB

## 2022-07-29 NOTE — Progress Notes (Signed)
Please schedule colposcopy for ASCUS pos HR HPV

## 2022-07-30 ENCOUNTER — Encounter (HOSPITAL_COMMUNITY): Payer: Self-pay | Admitting: *Deleted

## 2022-07-30 ENCOUNTER — Ambulatory Visit (HOSPITAL_COMMUNITY)
Admission: EM | Admit: 2022-07-30 | Discharge: 2022-07-30 | Disposition: A | Discharge status: Home / Self Care | Payer: Medicaid Other | Attending: Family Medicine | Admitting: Family Medicine

## 2022-07-30 DIAGNOSIS — B379 Candidiasis, unspecified: Secondary | ICD-10-CM

## 2022-07-30 DIAGNOSIS — L03317 Cellulitis of buttock: Secondary | ICD-10-CM | POA: Diagnosis not present

## 2022-07-30 DIAGNOSIS — R21 Rash and other nonspecific skin eruption: Secondary | ICD-10-CM

## 2022-07-30 MED ORDER — SULFAMETHOXAZOLE-TRIMETHOPRIM 800-160 MG PO TABS: 1.0000 | ORAL_TABLET | Freq: Two times a day (BID) | ORAL | 0 refills | Status: AC

## 2022-07-30 MED ORDER — NYSTATIN 100000 UNIT/GM EX CREA: TOPICAL_CREAM | CUTANEOUS | 0 refills | Status: AC

## 2022-07-30 NOTE — ED Provider Notes (Signed)
[facility]    CSN: [id number] Arrival date & time: 07/30/22  0801      History   Chief Complaint No chief complaint on file.   HPI Carrie Day is a 47 y.o. female.   Patient is here for rectal burning.  She was having issues with her bowels, constipation, and given mirilax which helped.  She then started with an "infection" at the rectal area.  This area is painful, there is a rash.  Painful to use wipe the area.  Today she started with bleeding at the area when she wiped.  She is using clobetasol cream to the areas as her ob/gyn gave her this for vulvar itching and she thought she was to use it there as well.  This morning she also used alovera gel and had a lot of burning.   She is also asking for refills, but not sure what medications she is on or the dosing or what needs to be refilled.       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    HLD (hyperlipidemia)    Hypertension    Legally blind    Seizures (HCC)    Stroke [facility])    Thyroid disease     Patient Active Problem List   Diagnosis Date Noted   Constipation 05/18/2022    Past Surgical History:  Procedure Laterality Date   CESAREAN SECTION  08/2012   JEJUNOSTOMY FEEDING TUBE     SPLENECTOMY     TRACHEOSTOMY      OB History     Gravida  4   Para  2   Term  2   Preterm      AB  2   Living  2      SAB      IAB  2   Ectopic      Multiple      Live Births  2            Home Medications    Prior to Admission medications   Medication Sig Start Date End Date Taking? Authorizing Provider  atorvastatin (LIPITOR) 10 MG tablet Take 10 mg by mouth daily.   Yes [provider]  clobetasol ointment (TEMOVATE) 0.05 % Apply to affected area every night for 4 weeks, then every other day for 4 weeks and then twice a week for 4 weeks or until resolution. 07/16/22  Yes Adam Phenix, MD  levETIRAcetam (KEPPRA) 500 MG tablet Take 500 mg  by mouth 2 (two) times daily. 07/29/22  Yes [provider]  naproxen (NAPROSYN) 375 MG tablet Take 375 mg by mouth 2 (two) times daily with a meal.   Yes [provider]  amLODipine (NORVASC) 10 MG tablet Take 10 mg by mouth daily. 11/28/21 07/07/22  [provider]  aspirin 81 MG chewable tablet Chew 81 mg by mouth daily. 11/28/21   [provider]  baclofen (LIORESAL) 10 MG tablet Take 10 mg by mouth 3 (three) times daily. 12/05/21   [provider]  levothyroxine (SYNTHROID) 100 MCG tablet Take 1 tablet (100 mcg total) by mouth daily before breakfast. 02/20/21   Mancel Bale, MD  phenytoin (DILANTIN) 100 MG ER capsule 100 mg 3 (three) times daily 04/04/20   [provider]  polyethylene glycol powder (GLYCOLAX/MIRALAX) 17 GM/SCOOP powder Take 17 g by mouth daily. 12/19/21   [provider]  valsartan (DIOVAN) 40 MG tablet Take  40 mg by mouth daily. 12/26/21   [provider]  Vitamin D, Ergocalciferol, (DRISDOL) 1.25 MG (50000 UNIT) CAPS capsule Take 50,000 Units by mouth once a week. 11/11/21   [provider]    Family History Family History  Problem Relation Age of Onset   Colon cancer Maternal Aunt    Breast cancer Paternal Aunt    Esophageal cancer Neg Hx    Rectal cancer Neg Hx    Stomach cancer Neg Hx     Social History Social History   Tobacco Use   Smoking status: Former    Types: Cigarettes    Quit date: 04/2022    Years since quitting: 0.2   Smokeless tobacco: Never   Tobacco comments:    3-4 cigs/day  Vaping Use   Vaping Use: Never used  Substance Use Topics   Alcohol use: Yes    Comment: occ   Drug use: Yes    Types: Marijuana     Allergies   Penicillins   Review of Systems Review of Systems  Constitutional: Negative.   HENT: Negative.    Respiratory: Negative.    Cardiovascular: Negative.   Gastrointestinal: Negative.   Genitourinary: Negative.   Skin:  Positive for rash.   Psychiatric/Behavioral: Negative.       Physical Exam Triage Vital Signs ED Triage Vitals  Enc Vitals Group     BP 07/30/22 0830 (!) 163/93     Pulse Rate 07/30/22 0830 (!) 51     Resp 07/30/22 0830 18     Temp 07/30/22 0830 98.2 F (36.8 C)     Temp Source 07/30/22 0830 Oral     SpO2 07/30/22 0830 94 %     Weight --      Height --      Head Circumference --      Peak Flow --      Pain Score 07/30/22 0826 10     Pain Loc --      Pain Edu? --      Excl. in GC? --    No data found.  Updated Vital Signs BP (!) 163/93 (BP Location: Left Arm)   Pulse (!) 51   Temp 98.2 F (36.8 C) (Oral)   Resp 18   LMP 09/03/2014   SpO2 94%   Visual Acuity Right Eye Distance:   Left Eye Distance:   Bilateral Distance:    Right Eye Near:   Left Eye Near:    Bilateral Near:     Physical Exam Constitutional:      Appearance: Normal appearance.     Comments: Sitting in a wheelchair  Cardiovascular:     Rate and Rhythm: Normal rate and regular rhythm.  Pulmonary:     Effort: Pulmonary effort is normal.     Breath sounds: Normal breath sounds.  Skin:    Comments: There is a large area of skin breakdown around the rectal area.  The skin is red, raw, no active drainage or bleeding but friable;  the extends to the mid gluteal fold to the rectum and to the introitus  Neurological:     General: No focal deficit present.     Mental Status: She is alert.  Psychiatric:        Mood and Affect: Mood normal.      UC Treatments / Results  Labs (all labs ordered are listed, but only abnormal results are displayed) Labs Reviewed - No data to display  EKG   Radiology  No results found.  Procedures Procedures (including critical care time)  Medications Ordered in UC Medications - No data to display  Initial Impression / Assessment and Plan / UC Course  I have reviewed the triage vital signs and the nursing notes.  Pertinent labs & imaging results that were available during  my care of the patient were reviewed by me and considered in my medical decision making (see chart for details).   Final Clinical Impressions(s) / UC Diagnoses   Final diagnoses:  Rash  Yeast infection  Cellulitis of buttock     Discharge Instructions      You were seen today for a rash around the rectum.  I have sent out a topical cream and oral antibiotic to use.  You may also purchase over the counter DESITIN cream to use to help soothe the area.  Do not use the clobetasol cream on this area.  If not improving then please follow up here or with your primary care provider.  Please touch base with your doctor as well about your medications and refills.     ED Prescriptions     Medication Sig Dispense Auth. Provider   nystatin cream (MYCOSTATIN) Apply to affected area 2 times daily 30 g Mckade Gurka, MD   sulfamethoxazole-trimethoprim (BACTRIM DS) 800-160 MG tablet Take 1 tablet by mouth 2 (two) times daily for 7 days. 14 tablet Jannifer Franklin, MD      PDMP not reviewed this encounter.   Jannifer Franklin, MD 07/30/22 [phone]

## 2022-07-30 NOTE — Progress Notes (Signed)
TC. Advised of results and need for colpo. Procedure explained. Pt verbalized understanding. Colpo will be scheduled by front office staff as soon as July schedule is out.

## 2022-07-30 NOTE — Discharge Instructions (Addendum)
You were seen today for a rash around the rectum.  I have sent out a topical cream and oral antibiotic to use.  You may also purchase over the counter DESITIN cream to use to help soothe the area.  Do not use the clobetasol cream on this area.  If not improving then please follow up here or with your primary care provider.  Please touch base with your doctor as well about your medications and refills.

## 2022-07-30 NOTE — ED Triage Notes (Signed)
Pt states rectal burning x 1 week. She states she was constipated and went to her PCP for that she started having BM. Her gyn gave her a cream (clobetasol) and advised she has a yeast infection the rectal burning started after using the cream.   Pt states she needs med refills but doesn't know her meds or doses.

## 2022-09-22 ENCOUNTER — Other Ambulatory Visit: Payer: Self-pay

## 2022-09-22 DIAGNOSIS — Z1231 Encounter for screening mammogram for malignant neoplasm of breast: Secondary | ICD-10-CM

## 2022-10-15 ENCOUNTER — Ambulatory Visit: Payer: Medicaid Other

## 2022-10-21 ENCOUNTER — Ambulatory Visit: Payer: Medicaid Other

## 2022-10-30 ENCOUNTER — Ambulatory Visit
Admission: RE | Admit: 2022-10-30 | Discharge: 2022-10-30 | Disposition: A | Discharge status: Home / Self Care | Payer: Medicaid Other | Source: Ambulatory Visit | Attending: Family | Admitting: Family

## 2022-10-30 DIAGNOSIS — Z1231 Encounter for screening mammogram for malignant neoplasm of breast: Secondary | ICD-10-CM

## 2023-01-05 ENCOUNTER — Encounter: Payer: Self-pay | Admitting: Physical Medicine and Rehabilitation

## 2023-01-05 ENCOUNTER — Other Ambulatory Visit (INDEPENDENT_AMBULATORY_CARE_PROVIDER_SITE_OTHER): Payer: Self-pay

## 2023-01-05 ENCOUNTER — Ambulatory Visit: Payer: Medicaid Other | Admitting: Physical Medicine and Rehabilitation

## 2023-01-05 VITALS — BP 147/93 | HR 62

## 2023-01-05 DIAGNOSIS — G894 Chronic pain syndrome: Secondary | ICD-10-CM | POA: Diagnosis not present

## 2023-01-05 DIAGNOSIS — M5441 Lumbago with sciatica, right side: Secondary | ICD-10-CM

## 2023-01-05 DIAGNOSIS — G8929 Other chronic pain: Secondary | ICD-10-CM

## 2023-01-05 DIAGNOSIS — R269 Unspecified abnormalities of gait and mobility: Secondary | ICD-10-CM

## 2023-01-05 DIAGNOSIS — M5442 Lumbago with sciatica, left side: Secondary | ICD-10-CM

## 2023-01-05 DIAGNOSIS — M5416 Radiculopathy, lumbar region: Secondary | ICD-10-CM

## 2023-01-05 DIAGNOSIS — Z87820 Personal history of traumatic brain injury: Secondary | ICD-10-CM
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
## 2023-01-26 ENCOUNTER — Ambulatory Visit: Payer: Medicaid Other | Attending: Physical Medicine and Rehabilitation | Admitting: Physical Therapy

## 2024-04-21 ENCOUNTER — Other Ambulatory Visit: Payer: Self-pay | Admitting: Family

## 2024-04-21 DIAGNOSIS — Z1231 Encounter for screening mammogram for malignant neoplasm of breast: Secondary | ICD-10-CM

## 2024-05-11 ENCOUNTER — Ambulatory Visit
